# Patient Record
Sex: Male | Born: 1955 | Race: Black or African American | Hispanic: No | Marital: Single | Smoking: Current every day smoker
Health system: Southern US, Community
[De-identification: ages and names within clinical notes are randomized; demographics above are authoritative.]

## PROBLEM LIST (undated history)

## (undated) DIAGNOSIS — E785 Hyperlipidemia, unspecified: Secondary | ICD-10-CM

## (undated) DIAGNOSIS — M1990 Unspecified osteoarthritis, unspecified site: Secondary | ICD-10-CM

## (undated) HISTORY — DX: Hyperlipidemia, unspecified: E78.5

## (undated) HISTORY — DX: Unspecified osteoarthritis, unspecified site: M19.90

---

## 1999-02-28 ENCOUNTER — Encounter: Payer: Self-pay | Admitting: Emergency Medicine

## 1999-03-01 ENCOUNTER — Encounter: Payer: Self-pay | Admitting: Internal Medicine

## 1999-03-01 ENCOUNTER — Observation Stay (HOSPITAL_COMMUNITY): Admission: EM | Admit: 1999-03-01 | Discharge: 1999-03-02 | Payer: Self-pay | Admitting: Emergency Medicine

## 1999-03-02 ENCOUNTER — Encounter: Payer: Self-pay | Admitting: Internal Medicine

## 1999-03-05 ENCOUNTER — Encounter: Admission: RE | Admit: 1999-03-05 | Discharge: 1999-03-05 | Payer: Self-pay | Admitting: Internal Medicine

## 1999-06-26 ENCOUNTER — Emergency Department (HOSPITAL_COMMUNITY): Admission: EM | Admit: 1999-06-26 | Discharge: 1999-06-26 | Payer: Self-pay | Admitting: Emergency Medicine

## 1999-12-13 ENCOUNTER — Encounter: Payer: Self-pay | Admitting: Emergency Medicine

## 1999-12-13 ENCOUNTER — Inpatient Hospital Stay (HOSPITAL_COMMUNITY): Admission: EM | Admit: 1999-12-13 | Discharge: 1999-12-14 | Payer: Self-pay | Admitting: Emergency Medicine

## 1999-12-14 ENCOUNTER — Encounter: Payer: Self-pay | Admitting: Internal Medicine

## 2000-10-14 ENCOUNTER — Encounter: Payer: Self-pay | Admitting: Emergency Medicine

## 2000-10-14 ENCOUNTER — Emergency Department (HOSPITAL_COMMUNITY): Admission: EM | Admit: 2000-10-14 | Discharge: 2000-10-14 | Payer: Self-pay | Admitting: Emergency Medicine

## 2001-10-12 ENCOUNTER — Encounter: Payer: Self-pay | Admitting: Emergency Medicine

## 2001-10-12 ENCOUNTER — Inpatient Hospital Stay (HOSPITAL_COMMUNITY): Admission: EM | Admit: 2001-10-12 | Discharge: 2001-10-14 | Payer: Self-pay | Admitting: Emergency Medicine

## 2001-10-22 ENCOUNTER — Encounter: Admission: RE | Admit: 2001-10-22 | Discharge: 2001-10-22 | Payer: Self-pay | Admitting: Family Medicine

## 2001-11-03 ENCOUNTER — Encounter: Admission: RE | Admit: 2001-11-03 | Discharge: 2001-11-03 | Payer: Self-pay | Admitting: Family Medicine

## 2002-11-23 ENCOUNTER — Encounter: Admission: RE | Admit: 2002-11-23 | Discharge: 2002-11-23 | Payer: Self-pay | Admitting: Sports Medicine

## 2003-04-09 ENCOUNTER — Emergency Department (HOSPITAL_COMMUNITY): Admission: EM | Admit: 2003-04-09 | Discharge: 2003-04-09 | Payer: Self-pay | Admitting: Emergency Medicine

## 2004-12-31 ENCOUNTER — Emergency Department (HOSPITAL_COMMUNITY): Admission: EM | Admit: 2004-12-31 | Discharge: 2004-12-31 | Payer: Self-pay | Admitting: Emergency Medicine

## 2005-12-22 ENCOUNTER — Emergency Department (HOSPITAL_COMMUNITY): Admission: EM | Admit: 2005-12-22 | Discharge: 2005-12-22 | Payer: Self-pay | Admitting: Emergency Medicine

## 2005-12-25 ENCOUNTER — Emergency Department (HOSPITAL_COMMUNITY): Admission: EM | Admit: 2005-12-25 | Discharge: 2005-12-25 | Payer: Self-pay | Admitting: Emergency Medicine

## 2005-12-30 ENCOUNTER — Emergency Department (HOSPITAL_COMMUNITY): Admission: EM | Admit: 2005-12-30 | Discharge: 2005-12-30 | Payer: Self-pay | Admitting: Emergency Medicine

## 2006-04-17 DIAGNOSIS — J309 Allergic rhinitis, unspecified: Secondary | ICD-10-CM | POA: Insufficient documentation

## 2007-04-15 ENCOUNTER — Emergency Department (HOSPITAL_COMMUNITY): Admission: EM | Admit: 2007-04-15 | Discharge: 2007-04-15 | Payer: Self-pay | Admitting: Family Medicine

## 2007-07-11 ENCOUNTER — Emergency Department (HOSPITAL_COMMUNITY): Admission: EM | Admit: 2007-07-11 | Discharge: 2007-07-11 | Payer: Self-pay | Admitting: Emergency Medicine

## 2009-07-26 ENCOUNTER — Ambulatory Visit: Payer: Self-pay | Admitting: Family Medicine

## 2009-07-26 DIAGNOSIS — F17200 Nicotine dependence, unspecified, uncomplicated: Secondary | ICD-10-CM

## 2009-07-26 DIAGNOSIS — E119 Type 2 diabetes mellitus without complications: Secondary | ICD-10-CM

## 2010-03-20 NOTE — Assessment & Plan Note (Signed)
Summary: 55yo M new pt   Vital Signs:  Patient profile:   55 year old male Height:      73.75 inches Weight:      192.8 pounds BMI:     25.01 Temp:     98.7 degrees F oral Pulse rate:   75 / minute BP sitting:   111 / 76  (left arm) Cuff size:   regular  Vitals Entered By: Gladstone Pih (July 26, 2009 4:12 PM) CC: establish care Is Patient Diabetic? Yes Did you bring your meter with you today? No Pain Assessment Patient in pain? yes      Onset of pain  Chronic all over pain   Primary Care Provider:  Marisue Ivan  CC:  establish care.  History of Present Illness: 55yo M new pt  Health problems: 1. New dx of DM 2010- Currently Novolog 70/30- 30 units in AM, 20 units in PM; Not checking CBGs regularly.  But when he does, states the CBGs range 100s-220s.  No hypoglycemic events.  Due for diabetic exam.  2. HLD- dx in 2010.  States that he was prescribed medication for 6 months and instructed to stop afterwards.    3. Tobacco use- 4cig/day ([place] 100s) x 40 year hx  4. Chronic pain- "Hurts all over".  Not on any current medication at the time.  No hx of injury, MVA, or trauma.  Preventative: No prior hx of screenings.  No desire at this time b/c cannot financially afford it.  Social: Recently released from prison.  No insurance.  Plans to see Rudell Cobb for assistance.  Has no money to afford medications at this time but still has enough insulin.    Habits & Providers  Alcohol-Tobacco-Diet     Tobacco Status: current     Tobacco Counseling: to quit use of tobacco products     Cigarette Packs/Day: 0.25  Current Medications (verified): 1)  Novolog Mix 70/30 Flexpen 70-30 % Susp (Insulin Aspart Prot & Aspart) .... 30 Units in Am, 20 Units in The Evening 2)  Aspir-Low 81 Mg Tbec (Aspirin) .Marland Kitchen.. 1 Tab By Mouth Daily  Allergies (verified): No Known Drug Allergies  Past History:  Past Medical History: H.Pylori treated 11/2001- EGD (duodenal ulcer) -  11/03/2001 DM HLD Tobacco user  Past Surgical History: none  Family History: Father- Deceased, murdered Mother- Deceased, unk medical hx Siblings- healthy  Social History: Lives w/ Delphine Little in [place]. Unemployed Tobacco user- 4 cig/day x 40 year hx Occasionally EtOH use Endorses MJN use Recently incarceratedSmoking Status:  current Packs/Day:  0.25  Review of Systems       no CP w/ exertion, SOB, palpitations, or syncopal events  Physical Exam  General:  VS Reviewed. Well appearing, NAD.  Eyes:  EOMI.  PERRLA.  Red Reflex- present and symmetric intensity  symmetric light reflex  Mouth:  poor dentition Neck:  supple, full ROM, no goiter or mass  Lungs:  Normal respiratory effort, chest expands symmetrically. Lungs are clear to auscultation, no crackles or wheezes. Heart:  Normal rate and regular rhythm. S1 and S2 normal without gallop, murmur, click, rub or other extra sounds. Abdomen:  Soft, NT, ND, no HSM, active BS  Msk:  no joint effusion or erythema FROM in all joints Extremities:  no peripheral edema Neurologic:  no focal deficits Skin:  no acute lesions   Impression & Recommendations:  Problem # 1:  DIABETES MELLITUS, TYPE II (ICD-250.00) Assessment Unchanged Not at goal A1c 9%  He was dx with DM in prison.  I'm not sure why he was immediately started on insulin.  Plan to switch him over to Metformin and check a C-peptide when he gets established with Rudell Cobb.   No changes to the regimen at this time. He is schedule to see Rudell Cobb this week and will f/u after that.  His updated medication list for this problem includes:    Novolog Mix 70/30 Flexpen 70-30 % Susp (Insulin aspart prot & aspart) .Marland KitchenMarland KitchenMarland KitchenMarland Kitchen 30 units in am, 20 units in the evening    Aspir-low 81 Mg Tbec (Aspirin) .Marland Kitchen... 1 tab by mouth daily  Orders: A1C-[facility] (83036)Future Orders: Comp Met-[facility] ([id number]) ... 08/01/2010 CBC w/Diff-[facility] ([id number]) ... 08/01/2010 Lipid-[facility] ([id number])  ... 08/01/2010  Problem # 2:  TOBACCO USER (ICD-305.1) Assessment: Unchanged 1/4 ppd.  Smoking cessation counseling provided. He is in the contemplative stage of quitting.  Problem # 3:  Preventive Health Care (ICD-V70.0) Assessment: Comment Only Pt cannot afford any recommended screening at this time. Awaiting for him to establish assistance with [facility]. Pt is to start ASA 81mg  daily. BP under good control. Will check lipid panel later.  Complete Medication List: 1)  Novolog Mix 70/30 Flexpen 70-30 % Susp (Insulin aspart prot & aspart) .... 30 units in am, 20 units in the evening 2)  Aspir-low 81 Mg Tbec (Aspirin) .Marland Kitchen.. 1 tab by mouth daily  Patient Instructions: 1)  Follow up with Rudell Cobb to establish means to obtain medications and labs. 2)  Return to the clinic without eating breakfast to get bloodwork.   3)  Set up a follow up appt with [place] after you get your bloodwork to discuss your medical problems.   Laboratory Results   Blood Tests   Date/Time Received: July 26, 2009 4:25 PM  Date/Time Reported: July 26, 2009 4:57 PM   HGBA1C: 9.0%   (Normal Range: Non-Diabetic - 3-6%   Control Diabetic - 6-8%)  Comments: ...............test performed by......Marland KitchenBonnie A. [place], MLS (ASCP)cm       Prevention & Chronic Care Immunizations   Influenza vaccine: Not documented    Tetanus booster: 10/19/2001: Done.    Pneumococcal vaccine: Not documented  Colorectal Screening   Hemoccult: Not documented   Hemoccult action/deferral: Deferred  (07/26/2009)    Colonoscopy: Not documented   Colonoscopy action/deferral: Deferred  (07/26/2009)  Other Screening   PSA: Not documented   PSA action/deferral: Discussed-decision deferred  (07/26/2009)   Smoking status: current  (07/26/2009)  Diabetes Mellitus   HgbA1C: 9.0  (07/26/2009)    Eye exam: Not documented    Foot exam: Not documented   High risk foot: Not documented   Foot care education: Not  documented    Urine microalbumin/creatinine ratio: Not documented    Diabetes flowsheet reviewed?: Yes   Progress toward A1C goal: Unchanged  Lipids   Total Cholesterol: Not documented   LDL: Not documented   LDL Direct: Not documented   HDL: Not documented   Triglycerides: Not documented  Self-Management Support :   Personal Goals (by the next clinic visit) :     Personal A1C goal: 8  (07/26/2009)     Personal blood pressure goal: 140/90  (07/26/2009)     Personal LDL goal: 100  (07/26/2009)    Patient will work on the following items until the next clinic visit to reach self-care goals:     Medications and monitoring: take my medicines every day, check my blood sugar, bring all of  my medications to every visit  (07/26/2009)     Eating: drink diet soda or water instead of juice or soda, eat more vegetables, use fresh or frozen vegetables, eat foods that are low in salt, eat baked foods instead of fried foods, eat fruit for snacks and desserts, limit or avoid alcohol  (07/26/2009)    Diabetes self-management support: CBG self-monitoring log, Written self-care plan, Education handout  (07/26/2009)   Diabetes care plan printed   Diabetes education handout printed

## 2010-07-06 NOTE — Consult Note (Signed)
NAME:  [name] [name], [name] [name]                           ACCOUNT NO.:  [id number]   MEDICAL RECORD NO.:  [id number]                   PATIENT TYPE:  INP   LOCATION:  3316                                 FACILITY:  [facility]   PHYSICIAN:  James L. Malon Kindle., M.D.          DATE OF BIRTH:  Dec 31, 1955   DATE OF CONSULTATION:  10/13/2001  DATE OF DISCHARGE:                                   CONSULTATION   REASON FOR CONSULTATION:  Hematemesis.   HISTORY:  This 55 year old patient who comes in unassigned through the  emergency room with hematemesis.  He had been working over the weekend and  felt sick without real specific symptoms but took some Advil.  York Spaniel he took  a total of 4 Advil.  This was the first time he ever had Advil in his life.  He subsequently vomited up dark material, had some fever and chills.  He had  a similar spell about 2 years ago.  He came in with chest pain, was ruled  out for myocardial infarction, had an exercise Cardiolite, was given  Protonix.  He left AMA at that time.  He has some history of ulcers in the  past but is not really sure how that was diagnosed but apparently during  that admission someone told him he might have ulcers.  He currently denies  any epigastric pain, dyspepsia, dysphasia.   MEDICATIONS ON ADMISSION:  Tylenol.  Takes no medicine chronically.   PAST MEDICAL HISTORY:  History of polysubstance abuse.  No previous  surgeries.  He does have a history of hypertension and high cholesterol.   FAMILY HISTORY:  Parents died of unknown causes.   SOCIAL HISTORY:  Lives in [place], works for a Geologist, engineering interstate bridges.  Has children who are not in the area.  He  lives alone and drinks 2 liters a day.  Does smoke marijuana.  On weekends  drinks more.   PHYSICAL EXAM:  Patient is afebrile.  Blood pressure 123/88.  GENERAL: Pleasant enough black male in no acute distress.  EYES: Clear, nonicteric.  NECK: Supple.  No  lymphadenopathy.  LUNGS: Clear.  HEART: Regular rate and rhythm.  No murmurs or gallops.  ABDOMEN: Soft and nontender.   ASSESSMENT:  Hematemesis probably due to either Mallory-Weiss tear,  gastritis or possibly an ulcer.  With his ethanol and cigarette use, I think  an endoscopy would be appropriate to rule out cancer.    PLAN:  We will go ahead and give him clear liquids now.  Continue the  Protonix and plan an endoscopy tomorrow.  I have discussed this with the  patient.  He is agreeable.  James L. Malon Kindle., M.D.    Waldron Session  D:  10/13/2001  T:  10/15/2001  Job:  [id number]   cc:   William A. Hensel, M.D.  [address] [place]  [place] [address]  Fax: [phone]

## 2010-07-06 NOTE — Discharge Summary (Signed)
NAME:  [name], [name] NO.:  [id number]   MEDICAL RECORD NO.:  [id number]                   PATIENT TYPE:  INP   LOCATION:  4733                                 FACILITY:  [facility]   PHYSICIAN:  Candance Bohlman DICTATOR                    DATE OF BIRTH:  23-Jan-1956   DATE OF ADMISSION:  10/12/2001  DATE OF DISCHARGE:  10/14/2001                                 DISCHARGE SUMMARY   DISCHARGE DIAGNOSES:  1. Duodenal ulcer.  2. GI bleed.  3. Tobacco abuse.  4. Alcohol abuse.  5. Polysubstance abuse with a positive history of cocaine and marijuana.   DISCHARGE MEDICATIONS:  Protonix 40 mg bid for ulcer.   HISTORY OF PRESENT ILLNESS:  The patient is a 55 year old African-American  male presenting secondary to a one day history of bloody emesis. The patient  reported three day history of vomiting with a total of seven to eight  episodes of vomiting per day. On the day of presentation, the patient  developed bloody emesis. He denied diarrhea, melena, or bloody stools.  Positive for chills and sweats. He has a tobacco history of one pack per day  for thirty years and Ethanol history of approximately (per patient), two  beers per day with occasional cocaine and marijuana use. The patient was  ad[facility] to [facility] for the following problems.   PROBLEM:  1. Upper GI bleed. Upon admission, the patient was hemodynamically stable     with hemoglobin of 17.3 and hematocrit of 52.8. He was typed and screened     and CBC's were drawn every four hours, to make sure that he did not     decompensate. Aggressive IV hydration was begun. His hemoglobin over the     next couple of days decreased but stabilized at 15.1 and 15.3 on October 03, 2001 and 16.4 on October 14, 2001. He was followed by Dr. Randa Evens of     Gastroenterology and upper endoscopy was done on October 14, 2001 that     showed a small duodenal ulcer. At the time of discharge a CLO  H-pylori     test was pending. He was encouraged to continue his Protonix bid for     which he was given a prescription and to avoid alcohol, aspirin, and     NSAIDS. His pain had resolved by the time of discharge and he was     tolerating a regular diet.  2. Dehydration, which was mild. He was aggressively hydrated overnight and     his electrolytes were stable. For the first night, he was NPO but     thereafter, tolerated a regular diet.  3. Leukocytosis. The patient had a CBC on admission that showed a WBC count     of 16.0. This was potentially due to acute phase reactant versus  gastritis. The patient was afebrile throughout this hospital stay. His     WBC count continued to decline to 14.7 on October 13, 2001 and 12.9 on     October 14, 2001, the day of his discharge.  4. Tobacco abuse. The patient was encouraged to quit smoking.  5. Alcohol abuse. He has no history of withdrawal. He did not experience any     DT's during admission and toxicology screen was negative for alcohol.  6. Polysubstance abuse. A toxicology screen was negative for all substances     including cocaine, heroin, other opiates, and benzodiazepines. He was     however, positive for marijuana.    FOLLOW UP:  1. With [facility] with Dr. Nino Parsley on Tuesday,     November 03, 2001 at 11:00 a.m.  2. With [facility] on [address]. The patient was     told to call for an appointment with Dr. Randa Evens in six weeks.                                               Jullianna Gabor DICTATOR    DD/MEDQ  D:  10/14/2001  T:  10/17/2001  Job:  [id number]   cc:   William A. Hensel, M.D.  [address] [place]  [place] [address]  Fax: [phone]   Nino Parsley, M.D.

## 2010-07-06 NOTE — Op Note (Signed)
   NAME:  [name] [name], [name] [name]                           ACCOUNT NO.:  [id number]   MEDICAL RECORD NO.:  [id number]                   PATIENT TYPE:  INP   LOCATION:  4733                                 FACILITY:  [facility]   PHYSICIAN:  James L. Malon Kindle., M.D.          DATE OF BIRTH:  01/23/1956   DATE OF PROCEDURE:  10/14/2001  DATE OF DISCHARGE:                                 OPERATIVE REPORT   PROCEDURE:  Esophagogastroduodenoscopy with biopsy.   MEDICATIONS:  Cetacaine spray, fentanyl 75 mcg, Versed 6 mg IV.   INDICATIONS:  Hematemesis.   DESCRIPTION OF PROCEDURE:  The procedure had been explained to the patient  and consent obtained.  With the patient in the left lateral decubitus  position, the Olympus upper endoscope was inserted and advanced under direct  visualization.  The stomach was entered, the pylorus identified and passed.  The duodenum including the bulb and the second portion was seen well.  There  was a small duodenal ulcer on the anterior wall with marked duodenitis.  The  scope was withdrawn back into the stomach.  The pyloric channel was normal.  The antrum was also normal.  A biopsy was taken for rapid urease test for  Helicobacter.  Fundus and cardia seen well on the retroflex view and were  normal.  The scope was withdrawn.  The distal and proximal esophagus was  seen well and was normal.  There were no varices and a minimal hiatal  hernia.  The proximal esophagus was normal.  The patient tolerated the  procedure well.  He was somewhat agitated.  There were no immediate  complications.   IMPRESSION:  Small duodenal ulcer and duodenitis, almost certainly the  source of his hematemesis.   PLAN:  Will check a CLOtest but discharge on Protonix.  Will see back in the  office in six weeks.                                               James L. Malon Kindle., M.D.    Waldron Session  D:  10/14/2001  T:  10/18/2001  Job:  [id number]   cc:   William A. Hensel, M.D.  [address] [place]  [place] [address]  Fax: [phone]

## 2010-07-06 NOTE — Discharge Summary (Signed)
[place]  Patient:    [name] [name], ATTIA                        MRN: [id number] Adm. Date:  [id number] Disc. Date: [id number] Attending:  Lewayne Bunting Dictator:   Darrol Jump CC:         [facility]   Discharge Summary  DATE OF BIRTH: Jan 09, 1956  DISCHARGE DIAGNOSES:  1. Chest pain.  2. Elevated blood pressure.  3. Tobacco use.  4. History of cocaine use.  5. Occasional alcohol use.  6. Peptic ulcer disease.  PROCEDURES: Stress Cardiolite study done on December 14, 1999, with the following results: Nondiagnostic because the patient was unable to achieve 85% of maximum predicted heart rate.  Ejection fraction was noted to be 46% by scan.  Scan also revealed small fixed defect but no apparent ischemia.  SPECIAL NOTE: The patient left against medical advice before being given discharge papers or prescriptions.  HISTORY OF PRESENT ILLNESS: This 55 year old African-American male, with no known cardiac history and an insignificant past medical history, was seen in the [facility] Room for complaint of chest pain on the evening of December 13, 1999.  The patient is a Corporate investment banker from [place] and he started having chest pain that was located on the left side the date prior to admission at work.  The pain was a fleeting sharp pain.  He denied any associated shortness of breath, radiation, nausea, diaphoresis, palpitations, or presyncope.  Again the day of admission at work he had chest pain that was of the same quality and had the same associated symptoms.  He was worried and decided to come to the emergency room.  His pain got better with rest.  He was pain-free on initial evaluation.  He did not receive any nitroglycerin.  ALLERGIES: No known drug allergies.  SOCIAL HISTORY: The patient has smoked one packs of cigarettes per day for 30 years.  He has positive alcohol use on the weekends.  He also  admits to occasional cocaine use.  PHYSICAL EXAMINATION:  GENERAL: Well-developed, well-nourished male in no acute distress.  VITAL SIGNS: Blood pressure 130/90, pulse 75, respirations 20.  NECK: Without JVD or bruits.  CARDIAC: Regular rate and rhythm, S1 and S2, positive S4; no murmurs, clicks, rubs, or gallops.  LUNGS: Clear to auscultation bilaterally.  ABDOMEN: Soft, nontender.  EXTREMITIES: Without edema.  LABORATORY DATA: EKG showed heart rate of 98, normal sinus rhythm; poor R wave progression.  Chest x-ray showed borderline heart size, indistinct vascular congestion suggesting mild CHF.  Hemoglobin 17, hematocrit 49.  Sodium 143, potassium 3.7, chloride 107, CO2 23, BUN 17, creatinine 1.3.  Glucose 110.  HOSPITAL COURSE: The patient was admitted for atypical chest pain.  He was placed on Lopressor 25 mg b.i.d. and aspirin.  His lipid profile came back elevated, with a total cholesterol of 248, triglyceride 155, HDL 39, and LDL 178.  He ruled out for myocardial infarction by enzymes.  Total CK #1 was 207, #2 was 179, #3 was 165, and #4 was 154.  CK-MB #1 was 2.3, #2 was 1.9,, #3 was 2.0, and #4 was 2.0.  Troponin I was 0.01 x 4.  Urine drug screen was positive for cocaine and cannabinoid, all other tests negative.  The patient went for an exercise Cardiolite study on December 14, 1999 and as noted above this was nondiagnostic because the patient was unable  to achieve 85% of predicted maximum heart rate.  The patient was somewhat concerned about his chest pain and he was advised that if he had recurrent chest pain he was to return to the ER.  It was decided at that time he would follow up in the office with the physician assistant and if he had recurrent pain he would be considered for possible catheterization.  Plans were for him to be discharged on aspirin, beta-blocker, and p.r.n. nitroglycerin.  The patient was awaiting discharge and unfortunately he was unable to  wait for proper discharge instructions and prescriptions.  He left AMA before being seen by the physician assistant.  DISCHARGE MEDICATIONS: Unknown.  FOLLOW-UP: A message has been left with the office to call the patient regarding a follow-up appointment with the physician assistant, either this week or next. DD:  12/25/99 TD:  12/25/99 Job: [id number]

## 2010-08-01 ENCOUNTER — Ambulatory Visit (INDEPENDENT_AMBULATORY_CARE_PROVIDER_SITE_OTHER): Payer: Medicaid Other | Admitting: Sports Medicine

## 2010-08-01 ENCOUNTER — Encounter: Payer: Self-pay | Admitting: Family Medicine

## 2010-08-01 VITALS — BP 127/88 | HR 71 | Temp 97.5°F | Ht 76.0 in | Wt 181.0 lb

## 2010-08-01 DIAGNOSIS — E119 Type 2 diabetes mellitus without complications: Secondary | ICD-10-CM

## 2010-08-01 DIAGNOSIS — E785 Hyperlipidemia, unspecified: Secondary | ICD-10-CM

## 2010-08-01 DIAGNOSIS — A6000 Herpesviral infection of urogenital system, unspecified: Secondary | ICD-10-CM

## 2010-08-01 LAB — COMPREHENSIVE METABOLIC PANEL
ALT: <8 U/L (ref 0–53)
AST: 9 U/L (ref 0–37)
Albumin: 4.6 g/dL (ref 3.5–5.2)
BUN: 15 mg/dL (ref 6–23)
Calcium: 10.3 mg/dL (ref 8.4–10.5)
Chloride: 101 mEq/L (ref 96–112)
Potassium: 4 mEq/L (ref 3.5–5.3)
Sodium: 136 mEq/L (ref 135–145)
Total Protein: 7.5 g/dL (ref 6.0–8.3)

## 2010-08-01 LAB — POCT GLYCOSYLATED HEMOGLOBIN (HGB A1C): Hemoglobin A1C: 13.5

## 2010-08-01 LAB — LIPID PANEL
Cholesterol: 279 mg/dL — ABNORMAL HIGH (ref 0–200)
LDL Cholesterol: 168 mg/dL — ABNORMAL HIGH (ref 0–99)
VLDL: 68 mg/dL — ABNORMAL HIGH (ref 0–40)

## 2010-08-01 LAB — POCT UA - MICROALBUMIN: Microalbumin Ur, POC: 80 mg/dL

## 2010-08-01 MED ORDER — LISINOPRIL 2.5 MG PO TABS: 2.5000 mg | ORAL_TABLET | Freq: Every day | ORAL | Status: DC

## 2010-08-01 MED ORDER — METFORMIN HCL 1000 MG PO TABS: 1000.0000 mg | ORAL_TABLET | Freq: Every day | ORAL | Status: DC

## 2010-08-01 MED ORDER — VALACYCLOVIR HCL 1 G PO TABS: 1000.0000 mg | ORAL_TABLET | Freq: Every day | ORAL | Status: AC

## 2010-08-01 MED ORDER — ASPIRIN EC 81 MG PO TBEC: 81.0000 mg | DELAYED_RELEASE_TABLET | Freq: Every day | ORAL | Status: AC

## 2010-08-01 NOTE — Patient Instructions (Signed)
Great to see you, Checking some bloodwork. Start Valtrex for Herpes. Metformin for diabetes. Baby aspirin for heart protection. Come back to see me before the end of June to go over all the bloodwork.  Ihor Austin. Benjamin Stain, M.D. [facility] [address] [place], [place] [address] [phone]  Diabetes, Type 2 Diabetes is a lasting (chronic) disease. In type 2 diabetes, the pancreas does not make enough insulin (a hormone), and the body does not respond normally to the insulin that is made. This type of diabetes was also previously called adult onset diabetes. About 90% of all those who have diabetes have type 2. It usually occurs after the age of 57 but can occur at any age. CAUSES Unlike type 1 diabetes, which happens because insulin is no longer being made, type 2 diabetes happens because the body is making less insulin and has trouble using the insulin properly. SYMPTOMS  Drinking more than usual.   Urinating more than usual.   Blurred vision.   Dry, itchy skin.   Frequent infection like yeast infections in women.   More tired than usual (fatigue).  TREATMENT  Healthy eating.   Exercise.   Medication, if needed.   Monitoring blood glucose (sugar).   Seeing your caregiver regularly.  HOME CARE INSTRUCTIONS  Check your blood glucose (sugar) at least once daily. More frequent monitoring may be necessary, depending on your medications and on how well your diabetes is controlled. Your caregiver will advise you.   Take your medicine as directed by your caregiver.   Do not smoke.   Make wise food choices. Ask your caregiver for information. Weight loss can improve your diabetes.   Learn about low blood glucose (hypoglycemia) and how to treat it.   Get your eyes checked regularly.   Have a yearly physical exam. Have your blood pressure checked. Get your blood and urine tested.   Wear a pendant or bracelet saying that you have diabetes.    Check your feet every night for sores. Let your caregiver know if you have sores that are not healing.  SEEK MEDICAL CARE IF:  You are having problems keeping your blood glucose at target range.   You feel you might be having problems with your medicines.   You have symptoms of an illness that is not improving after 24 hours.   You have a sore or wound that is not healing.   You notice a change in vision or a new problem with your vision.   You develop a fever of more than 100.4.  Document Released: 02/04/2005 Document Re-Released: 02/26/2009 [facility] Patient Information 2011 [place], [place].  Genital Herpes Genital herpes is a sexually transmitted disease. This means that it is a disease passed by having sex with an infected person. There is no cure for genital herpes. The time between attacks can be months to years. The virus may live in a person but produce no problems (symptoms). This infection can be passed to a baby as it travels down the birth canal (vagina). In a newborn, this can cause central nervous system damage, eye damage or even death. The virus that causes genital herpes is usually HSV-2 virus. The virus that causes oral herpes is usually HSV-1. The diagnosis (learning what is wrong) is made through culture results. SYMPTOMS Usually symptoms of pain and itching begin a couple days to a week after contact. It first appears as small blisters that progress to small painful ulcers which then scab over  and heal after several days. It affects the outer genitalia, birth canal, cervix, penis, anal area, buttocks and thighs. HOME CARE INSTRUCTIONS  Keep ulcerated areas dry and clean.   Take medications as directed. Antiviral medications can speed up healing. They will not prevent recurrences or cure this infection. These medications can also be taken for suppression if there are frequent recurrences.   WARNING: While the infection is active, it is contagious. Avoid all sexual  contact during active infections.   Condoms may help prevent spread of the herpes virus.   Practice safe sex.   Wash your hands thoroughly after touching the genital area.   Avoid touching your eyes after touching your genital area.   Inform your caregiver if you have had genital herpes and become pregnant. It is your responsibility to insure a safe outcome for your baby in this pregnancy.   Only take over-the-counter or prescription medicines for pain, discomfort, or fever as directed by your caregiver.  SEEK MEDICAL CARE IF:  You have a recurrence of this infection.   You do not respond to medications and are not improving.   You have new sources of pain or discharge which have changed from the original infection.   You have an oral temperature above 100.4.   You develop abdominal pain.   You develop eye pain or signs of eye infection.  Document Released: 02/02/2000 Document Re-Released: 05/01/2009 [facility] Patient Information 2011 [place], [place].

## 2010-08-01 NOTE — Progress Notes (Signed)
Addended by: Monica Becton on: 08/01/2010 05:05 PM   Modules accepted: Orders

## 2010-08-01 NOTE — Assessment & Plan Note (Addendum)
Checking HSV serologies to confirm. Valtrex daily. Counseled on safe sex practices. Checking HIV, RPR as well. I would recommend checking for Gc/Chlam at the next visit before he has voided.

## 2010-08-01 NOTE — Assessment & Plan Note (Addendum)
Starting metformin 1000. RTC to f/u other labs. Then RTC 3 months to recheck and augment meds. Lipid panel. Starting ASA 81mg  qd. CMET. Elevated microalbumin, will add low dose lisinopril.

## 2010-08-01 NOTE — Progress Notes (Signed)
  Subjective:    Patient ID: [name] [name], male    DOB: Jul 11, 1955, 55 y.o.   MRN: [id number]  HPI Here for new Pt eval.  Recently released from prison.  DM2:  Uncontrolled, was tx with insulin 70/30 in prison.  Genital lesion:  Bumps on penis, come and go, gets a burning/itching before they come up.  Sexually active with male partner and wears condom at all times.  Review of Systems    See HPI Objective:   Physical Exam  Constitutional: He appears well-developed and well-nourished. No distress.  HENT:  Head: Normocephalic and atraumatic.  Nose: Nose normal.  Mouth/Throat: Oropharynx is clear and moist.  Eyes: Conjunctivae and EOM are normal. Pupils are equal, round, and reactive to light.  Neck: Neck supple. No JVD present. No tracheal deviation present. No thyromegaly present.  Cardiovascular: Normal rate, regular rhythm and normal heart sounds.  Exam reveals no gallop and no friction rub.   No murmur heard. Pulmonary/Chest: Effort normal and breath sounds normal. No respiratory distress. He has no wheezes. He has no rales.  Abdominal: Soft. He exhibits no distension and no mass. There is no tenderness. There is no rebound and no guarding.  Genitourinary:          Shallow ulcers present over glans and foreskin. Some palpable LAD in bilateral groin.  Musculoskeletal: He exhibits no edema.  Lymphadenopathy:    He has no cervical adenopathy.  Neurological: He is alert.  Skin: Skin is warm and dry. He is not diaphoretic.          Assessment & Plan:

## 2010-08-02 ENCOUNTER — Telehealth: Payer: Self-pay | Admitting: *Deleted

## 2010-08-02 ENCOUNTER — Encounter: Payer: Self-pay | Admitting: Sports Medicine

## 2010-08-02 DIAGNOSIS — E785 Hyperlipidemia, unspecified: Secondary | ICD-10-CM | POA: Insufficient documentation

## 2010-08-02 MED ORDER — SIMVASTATIN 40 MG PO TABS: 40.0000 mg | ORAL_TABLET | Freq: Every day | ORAL | Status: DC

## 2010-08-02 NOTE — Progress Notes (Signed)
Addended by: Monica Becton on: 08/02/2010 08:51 AM   Modules accepted: Orders

## 2010-08-02 NOTE — Telephone Encounter (Signed)
Spoke with patient and informed him that lisinopril was called in and to add that to his medications due to the protein in his urine. He will discuss this at his next visit on 6/26

## 2010-08-02 NOTE — Assessment & Plan Note (Signed)
Starting simvastatin 40. Recheck in 3 months.

## 2010-08-02 NOTE — Telephone Encounter (Signed)
Message copied by Farrell Ours on Thu Aug 02, 2010  9:12 AM ------      Message from: Monica Becton      Created: Wed Aug 01, 2010  5:05 PM       Pt has some protein in urine, Adding lisinopril, pls call to let him know to add that to his medications.      Ihor Austin. Benjamin Stain, M.D.

## 2010-08-14 ENCOUNTER — Ambulatory Visit: Payer: Medicaid Other | Admitting: Sports Medicine

## 2010-09-12 ENCOUNTER — Ambulatory Visit (INDEPENDENT_AMBULATORY_CARE_PROVIDER_SITE_OTHER): Payer: Medicaid Other | Admitting: Family Medicine

## 2010-09-12 ENCOUNTER — Encounter: Payer: Self-pay | Admitting: Family Medicine

## 2010-09-12 VITALS — BP 134/92 | HR 75 | Ht 76.0 in | Wt 180.3 lb

## 2010-09-12 DIAGNOSIS — M549 Dorsalgia, unspecified: Secondary | ICD-10-CM

## 2010-09-12 MED ORDER — MELOXICAM 15 MG PO TABS: 15.0000 mg | ORAL_TABLET | Freq: Every day | ORAL | Status: AC

## 2010-09-12 MED ORDER — CYCLOBENZAPRINE HCL 10 MG PO TABS: 10.0000 mg | ORAL_TABLET | Freq: Three times a day (TID) | ORAL | Status: AC | PRN

## 2010-09-12 NOTE — Patient Instructions (Signed)
Try taking 500 or 650mg  of acetaminophen every six hours for a couple of days and see how your pain is. You can also try taking mobic 15mg  daily to help. Finally, I am giving you a prescription for flexeril to try, mainly at night, for pain.  It can make you sleepy, so don't take it before driving at first.

## 2010-09-20 NOTE — Progress Notes (Signed)
Subjective:   BACK PAIN  Location: Low back Quality: achy Onset: constant Worse with: activity, being on the floor Better with: rest Radiation: none Trauma: None Best sitting/standing/leaning forward: None  Red Flags Fecal/urinary incontinence: no  Numbness/Weakness: no  Fever/chills/sweats: no  Night pain: no  Unexplained weight loss: no  No relief with bedrest: no  h/o cancer/immunosuppression: no  IV drug use: no  PMH of osteoporosis or chronic steroid use: no   Objective:  Filed Vitals:   09/12/10 1715  BP: 134/92  Pulse: 75   GEN: NAD CV: RRR RESP: CTABL BACK: Pt has tight paraspinous muscles with pain on palpation.  No bony abnormalities. No point tenderness. EXT: No edema, 2+ pulses,

## 2010-09-20 NOTE — Assessment & Plan Note (Signed)
Pain appears to be either MSK or related to degenerative changes.  Will give prescription for Mobic and Flexeral and rec scheduled tylenol.  Imaging from several years ago is not worrisome and pt history has no red-flags.  Will follow up at a future date and possibly consider PT/ROM exercises.  Pt is somewhat resistant to taking medicine more than one time per day, hence Mobic.

## 2010-09-23 ENCOUNTER — Emergency Department (HOSPITAL_COMMUNITY)
Admission: EM | Admit: 2010-09-23 | Discharge: 2010-09-23 | Disposition: A | Discharge status: Home / Self Care | Payer: Medicaid Other | Attending: Emergency Medicine | Admitting: Emergency Medicine

## 2010-09-23 DIAGNOSIS — R112 Nausea with vomiting, unspecified: Secondary | ICD-10-CM | POA: Insufficient documentation

## 2010-09-23 DIAGNOSIS — R109 Unspecified abdominal pain: Secondary | ICD-10-CM | POA: Insufficient documentation

## 2010-09-23 DIAGNOSIS — E119 Type 2 diabetes mellitus without complications: Secondary | ICD-10-CM | POA: Insufficient documentation

## 2010-09-23 DIAGNOSIS — R10816 Epigastric abdominal tenderness: Secondary | ICD-10-CM | POA: Insufficient documentation

## 2010-09-23 DIAGNOSIS — E860 Dehydration: Secondary | ICD-10-CM | POA: Insufficient documentation

## 2010-09-23 LAB — DIFFERENTIAL
Basophils Absolute: 0 10*3/uL (ref 0.0–0.1)
Lymphocytes Relative: 22 % (ref 12–46)
Lymphs Abs: 3.3 10*3/uL (ref 0.7–4.0)
Neutro Abs: 10.2 10*3/uL — ABNORMAL HIGH (ref 1.7–7.7)
Neutrophils Relative %: 69 % (ref 43–77)

## 2010-09-23 LAB — COMPREHENSIVE METABOLIC PANEL
AST: 11 U/L (ref 0–37)
Alkaline Phosphatase: 86 U/L (ref 39–117)
BUN: 16 mg/dL (ref 6–23)
CO2: 27 mEq/L (ref 19–32)
Chloride: 98 mEq/L (ref 96–112)
Creatinine, Ser: 0.87 mg/dL (ref 0.50–1.35)
GFR calc non Af Amer: >60 mL/min (ref >60)
Potassium: 4.3 mEq/L (ref 3.5–5.1)
Total Bilirubin: 0.4 mg/dL (ref 0.3–1.2)

## 2010-09-23 LAB — GLUCOSE, CAPILLARY

## 2010-09-23 LAB — CBC
HCT: 49.1 % (ref 39.0–52.0)
MCV: 86.9 fL (ref 78.0–100.0)
RBC: 5.65 MIL/uL (ref 4.22–5.81)
WBC: 14.8 10*3/uL — ABNORMAL HIGH (ref 4.0–10.5)

## 2010-09-23 LAB — LIPASE, BLOOD: Lipase: 18 U/L (ref 11–59)

## 2010-11-14 LAB — COMPREHENSIVE METABOLIC PANEL
ALT: 20
AST: 23
Alkaline Phosphatase: 83
CO2: 21
Calcium: 9.5
Chloride: 102
GFR calc Af Amer: >60
GFR calc non Af Amer: >60
Glucose, Bld: 242 — ABNORMAL HIGH
Potassium: 4.5
Sodium: 134 — ABNORMAL LOW
Total Bilirubin: 1.3 — ABNORMAL HIGH

## 2010-11-14 LAB — DIFFERENTIAL
Basophils Relative: 0
Eosinophils Absolute: 0
Eosinophils Relative: 0
Lymphs Abs: 2.1
Neutrophils Relative %: 73

## 2010-11-14 LAB — LIPASE, BLOOD: Lipase: 52

## 2010-11-14 LAB — URINALYSIS, ROUTINE W REFLEX MICROSCOPIC
Bilirubin Urine: NEGATIVE
Glucose, UA: 100 — AB
Ketones, ur: NEGATIVE
Leukocytes, UA: NEGATIVE
Protein, ur: 30 — AB
pH: 6.5

## 2010-11-14 LAB — URINE MICROSCOPIC-ADD ON

## 2010-11-14 LAB — CBC
Hemoglobin: 16.2
MCHC: 34.7
RBC: 5.26
WBC: 12.2 — ABNORMAL HIGH

## 2011-08-20 ENCOUNTER — Other Ambulatory Visit: Payer: Self-pay | Admitting: *Deleted

## 2011-08-20 DIAGNOSIS — E119 Type 2 diabetes mellitus without complications: Secondary | ICD-10-CM

## 2011-08-21 ENCOUNTER — Telehealth: Payer: Self-pay | Admitting: Family Medicine

## 2011-08-21 MED ORDER — METFORMIN HCL 1000 MG PO TABS: 1000.0000 mg | ORAL_TABLET | Freq: Every day | ORAL | Status: DC

## 2011-08-21 NOTE — Telephone Encounter (Signed)
Received refill request for valtrex.  Will need appointment for this to be filled.

## 2011-08-21 NOTE — Telephone Encounter (Signed)
LVM for patient to call back to inform of below 

## 2011-12-09 ENCOUNTER — Other Ambulatory Visit: Payer: Self-pay | Admitting: Sports Medicine

## 2012-02-05 ENCOUNTER — Ambulatory Visit (INDEPENDENT_AMBULATORY_CARE_PROVIDER_SITE_OTHER): Payer: Medicaid Other | Admitting: Family Medicine

## 2012-02-05 ENCOUNTER — Encounter: Payer: Self-pay | Admitting: Family Medicine

## 2012-02-05 VITALS — BP 124/88 | HR 93 | Temp 98.2°F | Ht 76.0 in | Wt 169.9 lb

## 2012-02-05 DIAGNOSIS — E119 Type 2 diabetes mellitus without complications: Secondary | ICD-10-CM

## 2012-02-05 DIAGNOSIS — E1149 Type 2 diabetes mellitus with other diabetic neurological complication: Secondary | ICD-10-CM

## 2012-02-05 DIAGNOSIS — E785 Hyperlipidemia, unspecified: Secondary | ICD-10-CM

## 2012-02-05 DIAGNOSIS — E1140 Type 2 diabetes mellitus with diabetic neuropathy, unspecified: Secondary | ICD-10-CM

## 2012-02-05 DIAGNOSIS — E1142 Type 2 diabetes mellitus with diabetic polyneuropathy: Secondary | ICD-10-CM

## 2012-02-05 DIAGNOSIS — N400 Enlarged prostate without lower urinary tract symptoms: Secondary | ICD-10-CM

## 2012-02-05 LAB — POCT GLYCOSYLATED HEMOGLOBIN (HGB A1C): Hemoglobin A1C: 12

## 2012-02-05 MED ORDER — METFORMIN HCL 1000 MG PO TABS: 1000.0000 mg | ORAL_TABLET | Freq: Two times a day (BID) | ORAL | Status: DC

## 2012-02-05 MED ORDER — GLIPIZIDE 5 MG PO TABS: 5.0000 mg | ORAL_TABLET | Freq: Every day | ORAL | Status: DC

## 2012-02-05 MED ORDER — TAMSULOSIN HCL 0.4 MG PO CAPS: 0.4000 mg | ORAL_CAPSULE | Freq: Every day | ORAL | Status: DC

## 2012-02-05 NOTE — Progress Notes (Signed)
Patient ID: SIRRON FRANCESCONI, male   DOB: [birth year], 56 y.o.   MRN: [id number] Subjective: The patient is a 56 y.o. year old male who presents today for physical exam.  1. Diabetes: Patient continues to take metformin once a day on most days. He never checks his blood sugar. He has been on insulin in the past. He admits to poor diet and not very good exercise habits.  2. Hyperlipidemia: Patient stopped taking his Zocor. He just got tired of taking it.  3. Leg pain: Patient reports sensation of numbness, tingling, and shooting pains in his legs. an intermittent problem. It is not associated with any weakness. It is not associated with any bowel symptoms.  4. Urinary problems: Patient reports that for some time now he's been having problems with slow initiation of urination and decreased flow. He has been having some polyuria but no dysuria. He has never before been diagnosed with BPH has never been on any medications.  Patient's past medical, social, and family history were reviewed and updated as appropriate. History  Substance Use Topics  . Smoking status: Current Some Day Smoker -- 0.5 packs/day for 30 years    Types: Cigarettes  . Smokeless tobacco: Not on file  . Alcohol Use: 8.4 oz/week    14 Glasses of wine per week   Objective:  Filed Vitals:   02/05/12 1618  BP: 124/88  Pulse: 93  Temp: 98.2 F (36.8 C)   Gen: No acute distress, thin HEENT: Mucous members was, extraocular movements intact, poor dentition, multiple teeth are missing. Throat is nonerythematous. CV: Regular rate and rhythm, no murmurs appreciated Resp: Clear to auscultation bilaterally Abdomen: Soft, nontender, nondistended Ext: 2+ pulses, no edema  Assessment/Plan:  Please also see individual problems in problem list for problem-specific plans.

## 2012-02-05 NOTE — Assessment & Plan Note (Signed)
Signs and symptoms consistent with BPH. I will start an imperic Trial of Flomax. Followup in 2-3 weeks.

## 2012-02-05 NOTE — Assessment & Plan Note (Signed)
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 I am not starting any medications for now but will monitor this. He is not currently bothered enough with a 2 consider starting something like gabapentin.

## 2012-02-05 NOTE — Assessment & Plan Note (Signed)
The patient's diabetes is poorly controlled at this time. I had a long discussion with him about options and the fact that we would likely end up having to go down the road of insulin again if he does not make significant lifestyle changes. He seems open to this at this time. I will refer him for diabetic education and provide him with handouts on changes to his diet. I am recommending that he have a discussion with his wife about the number of sweets that are kept at home. I also increasing his dose of metformin and started him on glipizide. We'll follow him up closely.

## 2012-02-05 NOTE — Patient Instructions (Signed)
Diet Recommendations for Diabetes   Starchy (carb) foods include: Bread, rice, pasta, potatoes, corn, crackers, bagels, muffins, all baked goods.   Protein foods include: Meat, fish, poultry, eggs, dairy foods, and beans such as pinto and kidney beans (beans also provide carbohydrate).   1. Eat at least 3 meals and 1-2 snacks per day. Never go more than 4-5 hours while awake without eating.  2. Limit starchy foods to TWO per meal and ONE per snack. ONE portion of a starchy         food is equal to the following:   - ONE slice of bread (or its equivalent, such as half of a hamburger bun).   - 1/2 cup of a "scoopable" starchy food such as potatoes or rice.   - 1 OUNCE of starchy snack foods such as crackers or pretzels (look on label).   - 15 grams of carbohydrate as shown on food label.  3. Both lunch and dinner should include a protein food, a carb food, and vegetables.   - Obtain twice as many veg's as protein or carbohydrate foods for both lunch and     dinner.   - Try to keep frozen veg's on hand for a quick vegetable serving.     - Fresh or frozen veg's are best.    I would like you to come back to see me in ~3 weeks so we can talk some more about your diabetes. You are going to be taking Metformin 1000mg  two times per day and are adding glipizide 5mg  with breakfast. You are starting Flomax for your urination issues. I will have a nutritionist contact you about some more teaching about diabetes.

## 2012-02-05 NOTE — Assessment & Plan Note (Signed)
For the time being I will not restart him on his Zocor. As I am starting several other medications I do not want side effects to become unclear. I will, however, plan to readdress this when I see him back in several weeks.

## 2012-03-04 ENCOUNTER — Encounter: Payer: Self-pay | Admitting: Family Medicine

## 2012-03-04 ENCOUNTER — Ambulatory Visit (INDEPENDENT_AMBULATORY_CARE_PROVIDER_SITE_OTHER): Payer: Medicaid Other | Admitting: Family Medicine

## 2012-03-04 VITALS — BP 118/82 | HR 96 | Temp 98.7°F | Ht 76.0 in | Wt 170.0 lb

## 2012-03-04 DIAGNOSIS — N400 Enlarged prostate without lower urinary tract symptoms: Secondary | ICD-10-CM

## 2012-03-04 DIAGNOSIS — E785 Hyperlipidemia, unspecified: Secondary | ICD-10-CM

## 2012-03-04 DIAGNOSIS — E119 Type 2 diabetes mellitus without complications: Secondary | ICD-10-CM

## 2012-03-04 MED ORDER — GLIPIZIDE 5 MG PO TABS: 5.0000 mg | ORAL_TABLET | Freq: Every day | ORAL | Status: DC

## 2012-03-04 MED ORDER — ATORVASTATIN CALCIUM 40 MG PO TABS: 40.0000 mg | ORAL_TABLET | Freq: Every day | ORAL | Status: DC

## 2012-03-04 MED ORDER — TAMSULOSIN HCL 0.4 MG PO CAPS: 0.4000 mg | ORAL_CAPSULE | Freq: Every day | ORAL | Status: DC

## 2012-03-04 NOTE — Assessment & Plan Note (Signed)
I will ask the patient to see our nutritionist in clinic for diabetes. Hopefully this will be more affordable for him. I've also asked him to check his blood sugar daily in the morning to have some idea if his glycemic control between menses. I am not making any changes in his diabetic medication today.

## 2012-03-04 NOTE — Patient Instructions (Signed)
It was great to see you today! I want you to start taking Lipitor for your cholesterol.  You will continue taking all your other medications. Come back to see me in about a month. I am giving you the card for our nutritionist.  Call her to set up an appointment for more help with your diet.

## 2012-03-04 NOTE — Progress Notes (Signed)
Patient ID: [name] [name], male   DOB: 08-02-1955, 57 y.o.   MRN: [id number] Subjective: The patient is a 57 y.o. year old male who presents today for followup.  1. Diabetes: Patient was informed that diabetic education class and approximately $300. He does have is too much appointment. He has not been checking his blood sugar. He is not experiencing any symptoms that sound like hypoglycemia. He is not having any problems with either his metformin and glipizide.  2. Hyperlipidemia: Patient has a history of this. He stopped taking his Zocor. We discussed the need to start him on a medication today.  3. BPH: Patient reports relief of symptoms with Flomax and is no longer having problems with decreased urinary stream.  Patient's past medical, social, and family history were reviewed and updated as appropriate. History  Substance Use Topics  . Smoking status: Current Every Day Smoker -- 30 years    Types: Cigarettes  . Smokeless tobacco: Not on file     Comment: 7 cigs a day  . Alcohol Use: 8.4 oz/week    14 Glasses of wine per week   Objective:  Filed Vitals:   03/04/12 1614  BP: 118/82  Pulse: 96  Temp: 98.7 F (37.1 C)   Gen: Thin male, no distress CV: Regular rate and rhythm, no murmurs appreciated Resp: Clear to auscultation bilaterally Ext: No edema, 2+ pulses  Assessment/Plan:  Please also see individual problems in problem list for problem-specific plans.

## 2012-03-04 NOTE — Assessment & Plan Note (Signed)
Improved with Flomax. Continue this medication. In the future we may talk about the appropriateness of screening for prostate cancer.

## 2012-03-04 NOTE — Assessment & Plan Note (Signed)
Start Lipitor. Followup in one month. Patient advised of side effects and red flags. I will plan to recheck fasting lipid panel in about 3 months to monitor for effect.

## 2012-04-16 ENCOUNTER — Ambulatory Visit: Payer: Medicaid Other | Admitting: Family Medicine

## 2012-07-23 ENCOUNTER — Other Ambulatory Visit: Payer: Self-pay | Admitting: *Deleted

## 2012-07-23 DIAGNOSIS — E119 Type 2 diabetes mellitus without complications: Secondary | ICD-10-CM

## 2012-07-23 DIAGNOSIS — N400 Enlarged prostate without lower urinary tract symptoms: Secondary | ICD-10-CM

## 2012-07-23 DIAGNOSIS — E785 Hyperlipidemia, unspecified: Secondary | ICD-10-CM

## 2012-07-23 MED ORDER — GLIPIZIDE 5 MG PO TABS: 5.0000 mg | ORAL_TABLET | Freq: Every day | ORAL | Status: DC

## 2012-07-23 MED ORDER — TAMSULOSIN HCL 0.4 MG PO CAPS: 0.4000 mg | ORAL_CAPSULE | Freq: Every day | ORAL | Status: DC

## 2012-07-29 ENCOUNTER — Ambulatory Visit: Payer: Medicaid Other | Admitting: Family Medicine

## 2012-12-28 ENCOUNTER — Other Ambulatory Visit: Payer: Self-pay | Admitting: Family Medicine

## 2012-12-28 DIAGNOSIS — N400 Enlarged prostate without lower urinary tract symptoms: Secondary | ICD-10-CM

## 2012-12-28 DIAGNOSIS — E785 Hyperlipidemia, unspecified: Secondary | ICD-10-CM

## 2012-12-28 DIAGNOSIS — E119 Type 2 diabetes mellitus without complications: Secondary | ICD-10-CM

## 2012-12-28 MED ORDER — TAMSULOSIN HCL 0.4 MG PO CAPS: 0.4000 mg | ORAL_CAPSULE | Freq: Every day | ORAL | Status: DC

## 2012-12-28 MED ORDER — GLIPIZIDE 5 MG PO TABS: 5.0000 mg | ORAL_TABLET | Freq: Every day | ORAL | Status: DC

## 2013-01-27 ENCOUNTER — Other Ambulatory Visit: Payer: Self-pay | Admitting: Family Medicine

## 2013-01-27 DIAGNOSIS — E119 Type 2 diabetes mellitus without complications: Secondary | ICD-10-CM

## 2013-01-27 MED ORDER — METFORMIN HCL 1000 MG PO TABS: 1000.0000 mg | ORAL_TABLET | Freq: Two times a day (BID) | ORAL | Status: DC

## 2013-04-13 ENCOUNTER — Ambulatory Visit: Payer: Medicaid Other | Admitting: Family Medicine

## 2013-04-16 ENCOUNTER — Ambulatory Visit (INDEPENDENT_AMBULATORY_CARE_PROVIDER_SITE_OTHER): Payer: Medicare Other | Admitting: Family Medicine

## 2013-04-16 ENCOUNTER — Encounter: Payer: Self-pay | Admitting: Family Medicine

## 2013-04-16 VITALS — BP 118/83 | HR 86 | Temp 98.2°F | Ht 76.0 in | Wt 169.0 lb

## 2013-04-16 DIAGNOSIS — M549 Dorsalgia, unspecified: Secondary | ICD-10-CM

## 2013-04-16 DIAGNOSIS — K4090 Unilateral inguinal hernia, without obstruction or gangrene, not specified as recurrent: Secondary | ICD-10-CM | POA: Insufficient documentation

## 2013-04-16 MED ORDER — TRAMADOL HCL 50 MG PO TABS: 50.0000 mg | ORAL_TABLET | Freq: Three times a day (TID) | ORAL | Status: DC | PRN

## 2013-04-16 NOTE — Assessment & Plan Note (Addendum)
Likely inguinal hernia. Will send referral for surgical evaluation. Patient is in agreement with this. Reviewed red flags for return such as fever, vomiting, worsening pain, change in color, enlarging bulge without improvement. Patient expressed understanding.
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
## 2013-04-16 NOTE — Assessment & Plan Note (Signed)
Explained that pain medicine should ideally be prescribed by his primary care doctor. However, did agree to give him a short prescription of tramadol. Strongly recommended that he be seen by Dr. Paulina FusiHess for follow up

## 2013-04-16 NOTE — Patient Instructions (Signed)

## 2013-04-21 ENCOUNTER — Encounter (INDEPENDENT_AMBULATORY_CARE_PROVIDER_SITE_OTHER): Payer: Self-pay | Admitting: General Surgery

## 2013-04-21 ENCOUNTER — Ambulatory Visit (INDEPENDENT_AMBULATORY_CARE_PROVIDER_SITE_OTHER): Payer: Medicare Other | Admitting: General Surgery

## 2013-04-21 VITALS — BP 133/84 | HR 84 | Temp 97.7°F | Resp 18 | Ht 76.0 in | Wt 167.0 lb

## 2013-04-21 DIAGNOSIS — K4090 Unilateral inguinal hernia, without obstruction or gangrene, not specified as recurrent: Secondary | ICD-10-CM

## 2013-04-21 NOTE — Progress Notes (Signed)
Patient ID: [name] [name], male   DOB: [birth year], 58 y.o.   MRN: [id number]  Chief Complaint  Patient presents with  . Hernia    HPI [name] RedoCharles W Nickelson is a 58 y.o. male.  The patient is a 58 year old male who presents today for evaluation of a left inguinal hernia. This states he has had it there for approximately 1-1/2 months. She sits becoming more symptomatic. He states itself reducible when he lies down. HPI  Past Medical History  Diagnosis Date  . Diabetes mellitus   . Genital herpes   . Arthritis   . Hyperlipidemia     History reviewed. No pertinent past surgical history.  History reviewed. No pertinent family history.  Social History History  Substance Use Topics  . Smoking status: Current Every Day Smoker -- 30 years    Types: Cigarettes  . Smokeless tobacco: Not on file     Comment: 7 cigs a day  . Alcohol Use: 8.4 oz/week    14 Glasses of wine per week    No Known Allergies  Current Outpatient Prescriptions  Medication Sig Dispense Refill  . aspirin 81 MG tablet Take 81 mg by mouth daily.      Marland Kitchen. atorvastatin (LIPITOR) 40 MG tablet Take 1 tablet (40 mg total) by mouth daily.  30 tablet  3  . glipiZIDE (GLUCOTROL) 5 MG tablet Take 1 tablet (5 mg total) by mouth daily with breakfast.  30 tablet  5  . metFORMIN (GLUCOPHAGE) 1000 MG tablet Take 1 tablet (1,000 mg total) by mouth 2 (two) times daily with a meal.  60 tablet  5  . tamsulosin (FLOMAX) 0.4 MG CAPS capsule Take 1 capsule (0.4 mg total) by mouth daily.  30 capsule  5  . traMADol (ULTRAM) 50 MG tablet Take 1 tablet (50 mg total) by mouth every 8 (eight) hours as needed.  30 tablet  0   No current facility-administered medications for this visit.    Review of Systems Review of Systems  Constitutional: Negative.   HENT: Negative.   Eyes: Negative.   Respiratory: Negative.   Cardiovascular: Negative.   Gastrointestinal: Negative.   Endocrine: Negative.   Neurological: Negative.     Blood  pressure 133/84, pulse 84, temperature 97.7 F (36.5 C), temperature source Temporal, resp. rate 18, height 6\' 4"  (1.93 m), weight 167 lb (75.751 kg).  Physical Exam Physical Exam  Constitutional: He is oriented to person, place, and time. He appears well-developed and well-nourished.  HENT:  Head: Normocephalic and atraumatic.  Eyes: Conjunctivae and EOM are normal. Pupils are equal, round, and reactive to light.  Neck: Normal range of motion. Neck supple.  Cardiovascular: Normal rate, regular rhythm and normal heart sounds.   Pulmonary/Chest: Effort normal and breath sounds normal.  Abdominal: Soft. Bowel sounds are normal. He exhibits no distension and no mass. There is no tenderness. There is no rebound and no guarding. A hernia is present. Hernia confirmed positive in the left inguinal area. Hernia confirmed negative in the right inguinal area.  Musculoskeletal: Normal range of motion.  Neurological: He is alert and oriented to person, place, and time.  Skin: Skin is warm and dry.    Data Reviewed none  Assessment    58 year old male with a left inguinal hernia     Plan    1. We'll proceed to the operating room for laparoscopic left inguinal hernia Repair with Mesh. 2. All risks and benefits were discussed with the patient, to generally include  infection, bleeding, damage to surrounding structures, acute and chronic nerve pain, and recurrence. Alternatives were offered and described.  All questions were answered and the patient voiced understanding of the procedure and wishes to proceed at this point.         Marigene Ehlers., Leiby Pigeon 04/21/2013, 4:37 PM

## 2013-04-22 ENCOUNTER — Emergency Department (HOSPITAL_COMMUNITY)
Admission: EM | Admit: 2013-04-22 | Discharge: 2013-04-22 | Disposition: A | Discharge status: Home / Self Care | Payer: Medicare Other | Attending: Emergency Medicine | Admitting: Emergency Medicine

## 2013-04-22 ENCOUNTER — Encounter (HOSPITAL_COMMUNITY): Payer: Self-pay | Admitting: Emergency Medicine

## 2013-04-22 DIAGNOSIS — Z8619 Personal history of other infectious and parasitic diseases: Secondary | ICD-10-CM | POA: Insufficient documentation

## 2013-04-22 DIAGNOSIS — K4090 Unilateral inguinal hernia, without obstruction or gangrene, not specified as recurrent: Secondary | ICD-10-CM | POA: Insufficient documentation

## 2013-04-22 DIAGNOSIS — F17200 Nicotine dependence, unspecified, uncomplicated: Secondary | ICD-10-CM | POA: Insufficient documentation

## 2013-04-22 DIAGNOSIS — R369 Urethral discharge, unspecified: Secondary | ICD-10-CM

## 2013-04-22 DIAGNOSIS — M129 Arthropathy, unspecified: Secondary | ICD-10-CM | POA: Insufficient documentation

## 2013-04-22 DIAGNOSIS — R739 Hyperglycemia, unspecified: Secondary | ICD-10-CM

## 2013-04-22 DIAGNOSIS — Z7982 Long term (current) use of aspirin: Secondary | ICD-10-CM | POA: Insufficient documentation

## 2013-04-22 DIAGNOSIS — E785 Hyperlipidemia, unspecified: Secondary | ICD-10-CM | POA: Insufficient documentation

## 2013-04-22 DIAGNOSIS — Z79899 Other long term (current) drug therapy: Secondary | ICD-10-CM | POA: Insufficient documentation

## 2013-04-22 DIAGNOSIS — E119 Type 2 diabetes mellitus without complications: Secondary | ICD-10-CM | POA: Insufficient documentation

## 2013-04-22 LAB — URINALYSIS, ROUTINE W REFLEX MICROSCOPIC
BILIRUBIN URINE: NEGATIVE
Ketones, ur: 40 mg/dL — AB
Nitrite: NEGATIVE
PROTEIN: 100 mg/dL — AB
Specific Gravity, Urine: 1.037 — ABNORMAL HIGH (ref 1.005–1.030)
Urobilinogen, UA: 0.2 mg/dL (ref 0.0–1.0)
pH: 6 (ref 5.0–8.0)

## 2013-04-22 LAB — CBC WITH DIFFERENTIAL/PLATELET
BASOS ABS: 0 10*3/uL (ref 0.0–0.1)
Basophils Relative: 0 % (ref 0–1)
EOS PCT: 1 % (ref 0–5)
Eosinophils Absolute: 0.1 10*3/uL (ref 0.0–0.7)
HCT: 42.7 % (ref 39.0–52.0)
Hemoglobin: 15.5 g/dL (ref 13.0–17.0)
Lymphocytes Relative: 11 % — ABNORMAL LOW (ref 12–46)
Lymphs Abs: 1.8 10*3/uL (ref 0.7–4.0)
MCH: 31.1 pg (ref 26.0–34.0)
MCHC: 36.3 g/dL — ABNORMAL HIGH (ref 30.0–36.0)
MCV: 85.6 fL (ref 78.0–100.0)
Monocytes Absolute: 1.3 10*3/uL — ABNORMAL HIGH (ref 0.1–1.0)
Monocytes Relative: 8 % (ref 3–12)
Neutro Abs: 13.6 10*3/uL — ABNORMAL HIGH (ref 1.7–7.7)
Neutrophils Relative %: 81 % — ABNORMAL HIGH (ref 43–77)
Platelets: 319 10*3/uL (ref 150–400)
RBC: 4.99 MIL/uL (ref 4.22–5.81)
RDW: 13.1 % (ref 11.5–15.5)
WBC: 16.8 10*3/uL — ABNORMAL HIGH (ref 4.0–10.5)

## 2013-04-22 LAB — BASIC METABOLIC PANEL
BUN: 18 mg/dL (ref 6–23)
CALCIUM: 10.1 mg/dL (ref 8.4–10.5)
CO2: 22 mEq/L (ref 19–32)
CREATININE: 0.84 mg/dL (ref 0.50–1.35)
Chloride: 94 mEq/L — ABNORMAL LOW (ref 96–112)
GFR calc Af Amer: >90 mL/min (ref >90)
GFR calc non Af Amer: >90 mL/min (ref >90)
GLUCOSE: 437 mg/dL — AB (ref 70–99)
Potassium: 4.4 mEq/L (ref 3.7–5.3)
Sodium: 134 mEq/L — ABNORMAL LOW (ref 137–147)

## 2013-04-22 LAB — URINE MICROSCOPIC-ADD ON

## 2013-04-22 LAB — CBG MONITORING, ED
GLUCOSE-CAPILLARY: 288 mg/dL — AB (ref 70–99)
Glucose-Capillary: 373 mg/dL — ABNORMAL HIGH (ref 70–99)

## 2013-04-22 MED ORDER — HYDROCODONE-ACETAMINOPHEN 5-325 MG PO TABS: 1.0000 | ORAL_TABLET | Freq: Four times a day (QID) | ORAL | Status: DC | PRN

## 2013-04-22 MED ORDER — SODIUM CHLORIDE 0.9 % IV BOLUS (SEPSIS)
1000.0000 mL | Freq: Once | INTRAVENOUS | Status: AC
  Administered 2013-04-22: 1000 mL via INTRAVENOUS

## 2013-04-22 MED ORDER — CEFTRIAXONE SODIUM 250 MG IJ SOLR: 250.0000 mg | Freq: Once | INTRAMUSCULAR | Status: DC

## 2013-04-22 MED ORDER — AZITHROMYCIN 250 MG PO TABS
1000.0000 mg | ORAL_TABLET | Freq: Once | ORAL | Status: AC
  Administered 2013-04-22: 1000 mg via ORAL
  Filled 2013-04-22: qty 4

## 2013-04-22 MED ORDER — INSULIN ASPART 100 UNIT/ML ~~LOC~~ SOLN: 10.0000 [IU] | Freq: Once | SUBCUTANEOUS | Status: DC

## 2013-04-22 MED ORDER — ONDANSETRON HCL 4 MG/2ML IJ SOLN
4.0000 mg | Freq: Once | INTRAMUSCULAR | Status: AC
  Administered 2013-04-22: 4 mg via INTRAVENOUS
  Filled 2013-04-22: qty 2

## 2013-04-22 MED ORDER — DEXTROSE 5 % IV SOLN
250.0000 mg | Freq: Once | INTRAVENOUS | Status: AC
  Administered 2013-04-22: 250 mg via INTRAVENOUS
  Filled 2013-04-22: qty 250

## 2013-04-22 MED ORDER — MORPHINE SULFATE 4 MG/ML IJ SOLN
4.0000 mg | Freq: Once | INTRAMUSCULAR | Status: AC
  Administered 2013-04-22: 4 mg via INTRAVENOUS
  Filled 2013-04-22: qty 1

## 2013-04-22 NOTE — ED Provider Notes (Signed)
Patient [facility] in the Emergency Department [facility] Patient reports left inguinal hernia/pain Exam : awake/alert, abd soft, hernia has been reduced Plan: labs pending but I anticipate d/c home I doubt any other acute abdominal process at this time    Joya Gaskinsonald W Ayriana Wix, MD 04/22/13 [phone]

## 2013-04-22 NOTE — ED Notes (Addendum)
Pt presents with intermittent Left inguinal hernia pain x1 week with increase pain last night, pt also reports a yellow penile discharge this am. Pt states he was at [facility] yesterday, they are to call him back to set up his surgery for March 31st.

## 2013-04-22 NOTE — Discharge Instructions (Signed)
Hernia A hernia occurs when an internal organ pushes out through a weak spot in the abdominal wall. Hernias most commonly occur in the groin and around the navel. Hernias often can be pushed back into place (reduced). Most hernias tend to get worse over time. Some abdominal hernias can get stuck in the opening (irreducible or incarcerated hernia) and cannot be reduced. An irreducible abdominal hernia which is tightly squeezed into the opening is at risk for impaired blood supply (strangulated hernia). A strangulated hernia is a medical emergency. Because of the risk for an irreducible or strangulated hernia, surgery may be recommended to repair a hernia. CAUSES   Heavy lifting.  Prolonged coughing.  Straining to have a bowel movement.  A cut (incision) made during an abdominal surgery. HOME CARE INSTRUCTIONS   Bed rest is not required. You may continue your normal activities.  Avoid lifting more than 10 pounds (4.5 kg) or straining.  Cough gently. If you are a smoker it is best to stop. Even the best hernia repair can break down with the continual strain of coughing. Even if you do not have your hernia repaired, a cough will continue to aggravate the problem.  Do not wear anything tight over your hernia. Do not try to keep it in with an outside bandage or truss. These can damage abdominal contents if they are trapped within the hernia sac.  Eat a normal diet.  Avoid constipation. Straining over long periods of time will increase hernia size and encourage breakdown of repairs. If you cannot do this with diet alone, stool softeners may be used. SEEK IMMEDIATE MEDICAL CARE IF:   You have a fever.  You develop increasing abdominal pain.  You feel nauseous or vomit.  Your hernia is stuck outside the abdomen, looks discolored, feels hard, or is tender.  You have any changes in your bowel habits or in the hernia that are unusual for you.  You have increased pain or swelling around the  hernia.  You cannot push the hernia back in place by applying gentle pressure while lying down. MAKE SURE YOU:   Understand these instructions.  Will watch your condition.  Will get help right away if you are not doing well or get worse. Document Released: 02/04/2005 Document Revised: 04/29/2011 Document Reviewed: 09/24/2007 [facility] Patient Information 2014 [place], [place]. Sexually Transmitted Disease A sexually transmitted disease (STD) is a disease or infection that may be passed (transmitted) from person to person, usually during sexual activity. This may happen by way of saliva, semen, blood, vaginal mucus, or urine. Common STDs include:   Gonorrhea.   Chlamydia.   Syphilis.   HIV and AIDS.   Genital herpes.   Hepatitis B and C.   Trichomonas.   Human papillomavirus (HPV).   Pubic lice.   Scabies.  Mites.  Bacterial vaginosis. WHAT ARE CAUSES OF STDs? An STD may be caused by bacteria, a virus, or parasites. STDs are often transmitted during sexual activity if one person is infected. However, they may also be transmitted through nonsexual means. STDs may be transmitted after:   Sexual intercourse with an infected person.   Sharing sex toys with an infected person.   Sharing needles with an infected person or using unclean piercing or tattoo needles.  Having intimate contact with the genitals, mouth, or rectal areas of an infected person.   Exposure to infected fluids during birth. WHAT ARE THE SIGNS AND SYMPTOMS OF STDs? Different STDs have different symptoms. Some people may not  have any symptoms. If symptoms are present, they may include:   Painful or bloody urination.   Pain in the pelvis, abdomen, vagina, anus, throat, or eyes.   Skin rash, itching, irritation, growths, sores (lesions), ulcerations, or warts in the genital or anal area.  Abnormal vaginal discharge with or without bad odor.   Penile discharge in men.   Fever.    Pain or bleeding during sexual intercourse.   Swollen glands in the groin area.   Yellow skin and eyes (jaundice). This is seen with hepatitis.   Swollen testicles.  Infertility.  Sores and blisters in the mouth. HOW ARE STDs DIAGNOSED? To make a diagnosis, your health care provider may:   Take a medical history.   Perform a physical exam.   Take a sample of any discharge for examination.  Swab the throat, cervix, opening to the penis, rectum, or vagina for testing.  Test a sample of your first morning urine.   Perform blood tests.   Perform a Pap smear, if this applies.   Perform a colposcopy.   Perform a laparoscopy.  HOW ARE STDs TREATED? Treatment depends on the STD. Some STDs may be treated but not cured.   Chlamydia, gonorrhea, trichomonas, and syphilis can be cured with antibiotics.   Genital herpes, hepatitis, and HIV can be treated, but not cured, with prescribed medicines. The medicines lessen symptoms.   Genital warts from HPV can be treated with medicine or by freezing, burning (electrocautery), or surgery. Warts may come back.   HPV cannot be cured with medicine or surgery. However, abnormal areas may be removed from the cervix, vagina, or vulva.   If your diagnosis is confirmed, your recent sexual partners need treatment. This is true even if they are symptom-free or have a negative culture or evaluation. They should not have sex until their health care providers say it is OK. HOW CAN I REDUCE MY RISK OF GETTING AN STD?  Use latex condoms, dental dams, and water-soluble lubricants during sexual activity. Do not use petroleum jelly or oils.  Get vaccinated for HPV and hepatitis. If you have not received these vaccines in the past, talk to your health care provider about whether one or both might be right for you.   Avoid risky sex practices that can break the skin.  WHAT SHOULD I DO IF I THINK I HAVE AN STD?  See your health care  provider.   Inform all sexual partners. They should be tested and treated for any STDs.  Do not have sex until your health care provider says it is OK. WHEN SHOULD I GET HELP? Seek immediate medical care if:  You develop severe abdominal pain.  You are a man and notice swelling or pain in the testicles.  You are a woman and notice swelling or pain in your vagina. Document Released: 04/27/2002 Document Revised: 11/25/2012 Document Reviewed: 08/25/2012 [facility] Patient Information 2014 [place], [place].

## 2013-04-22 NOTE — ED Provider Notes (Signed)
CSN: [id number]     Arrival date & time 04/22/13  0746 History   First MD Initiated Contact with Patient 04/22/13 [phone]     Chief Complaint  Patient presents with  . Inguinal Hernia     (Consider location/radiation/quality/duration/timing/severity/associated sxs/prior Treatment) HPI Comments: Patient presents emergency department with chief complaint of left-sided inguinal hernia and associated pain, nausea and vomiting. He states that he has had the hernia for the past month and a half or so. He was seen yesterday by general surgery in the office, and was advised to have hernia repair performed later this month. He was prescribed tramadol, which she states is not working. Additionally, he complains of dysuria and penile discharge. He denies fever. Denies changes in bowel movements. He states that his nausea and vomiting have been ongoing for the past 4 days.  The history is provided by the patient. No language interpreter was used.    Past Medical History  Diagnosis Date  . Diabetes mellitus   . Genital herpes   . Arthritis   . Hyperlipidemia    History reviewed. No pertinent past surgical history. History reviewed. No pertinent family history. History  Substance Use Topics  . Smoking status: Current Every Day Smoker -- 30 years    Types: Cigarettes  . Smokeless tobacco: Not on file     Comment: 7 cigs a day  . Alcohol Use: 8.4 oz/week    14 Glasses of wine per week    Review of Systems  Constitutional: Negative for fever and chills.  Respiratory: Negative for shortness of breath.   Cardiovascular: Negative for chest pain.  Gastrointestinal: Positive for abdominal pain. Negative for nausea, vomiting, diarrhea and constipation.  Genitourinary: Positive for dysuria and discharge.      Allergies  Tramadol  Home Medications   Current Outpatient Rx  Name  Route  Sig  Dispense  Refill  . aspirin 81 MG tablet   Oral   Take 81 mg by mouth daily.         Marland Kitchen atorvastatin  (LIPITOR) 40 MG tablet   Oral   Take 1 tablet (40 mg total) by mouth daily.   30 tablet   3   . glipiZIDE (GLUCOTROL) 5 MG tablet   Oral   Take 1 tablet (5 mg total) by mouth daily with breakfast.   30 tablet   5   . metFORMIN (GLUCOPHAGE) 1000 MG tablet   Oral   Take 1 tablet (1,000 mg total) by mouth 2 (two) times daily with a meal.   60 tablet   5   . tamsulosin (FLOMAX) 0.4 MG CAPS capsule   Oral   Take 1 capsule (0.4 mg total) by mouth daily.   30 capsule   5   . traMADol (ULTRAM) 50 MG tablet   Oral   Take 1 tablet (50 mg total) by mouth every 8 (eight) hours as needed.   30 tablet   0    BP 144/93  Pulse 83  Temp(Src) 98.1 F (36.7 C) (Oral)  Resp 22  Ht 6\' 4"  (1.93 m)  Wt 167 lb (75.751 kg)  BMI 20.34 kg/m2  SpO2 98% Physical Exam  Nursing note and vitals reviewed. Constitutional: He is oriented to person, place, and time. He appears well-developed and well-nourished.  HENT:  Head: Normocephalic and atraumatic.  Eyes: Conjunctivae and EOM are normal. Pupils are equal, round, and reactive to light. Right eye exhibits no discharge. Left eye exhibits no discharge. No scleral  icterus.  Neck: Normal range of motion. Neck supple. No JVD present.  Cardiovascular: Normal rate, regular rhythm and normal heart sounds.  Exam reveals no gallop and no friction rub.   No murmur heard. Pulmonary/Chest: Effort normal and breath sounds normal. No respiratory distress. He has no wheezes. He has no rales. He exhibits no tenderness.  Abdominal: Soft. He exhibits no distension and no mass. There is no tenderness. There is no rebound and no guarding.  Left sided inguinal hernia, reducible with palpation while lying supine  Genitourinary:  Uncircumcised, Flow penile discharge  Musculoskeletal: Normal range of motion. He exhibits no edema and no tenderness.  Neurological: He is alert and oriented to person, place, and time.  Skin: Skin is warm and dry.  Psychiatric: He has a  normal mood and affect. His behavior is normal. Judgment and thought content normal.    ED Course  Procedures (including critical care time) Results for orders placed during the hospital encounter of 04/22/13  URINALYSIS, ROUTINE W REFLEX MICROSCOPIC      Result Value Ref Range   Color, Urine YELLOW  YELLOW   APPearance TURBID (*) CLEAR   Specific Gravity, Urine 1.037 (*) 1.005 - 1.030   pH 6.0  5.0 - 8.0   Glucose, UA >1000 (*) NEGATIVE mg/dL   Hgb urine dipstick LARGE (*) NEGATIVE   Bilirubin Urine NEGATIVE  NEGATIVE   Ketones, ur 40 (*) NEGATIVE mg/dL   Protein, ur 161100 (*) NEGATIVE mg/dL   Urobilinogen, UA 0.2  0.0 - 1.0 mg/dL   Nitrite NEGATIVE  NEGATIVE   Leukocytes, UA MODERATE (*) NEGATIVE  CBC WITH DIFFERENTIAL      Result Value Ref Range   WBC 16.8 (*) 4.0 - 10.5 K/uL   RBC 4.99  4.22 - 5.81 MIL/uL   Hemoglobin 15.5  13.0 - 17.0 g/dL   HCT 09.642.7  04.539.0 - 40.952.0 %   MCV 85.6  78.0 - 100.0 fL   MCH 31.1  26.0 - 34.0 pg   MCHC 36.3 (*) 30.0 - 36.0 g/dL   RDW 81.113.1  91.411.5 - 78.215.5 %   Platelets 319  150 - 400 K/uL   Neutrophils Relative % 81 (*) 43 - 77 %   Neutro Abs 13.6 (*) 1.7 - 7.7 K/uL   Lymphocytes Relative 11 (*) 12 - 46 %   Lymphs Abs 1.8  0.7 - 4.0 K/uL   Monocytes Relative 8  3 - 12 %   Monocytes Absolute 1.3 (*) 0.1 - 1.0 K/uL   Eosinophils Relative 1  0 - 5 %   Eosinophils Absolute 0.1  0.0 - 0.7 K/uL   Basophils Relative 0  0 - 1 %   Basophils Absolute 0.0  0.0 - 0.1 K/uL  BASIC METABOLIC PANEL      Result Value Ref Range   Sodium 134 (*) 137 - 147 mEq/L   Potassium 4.4  3.7 - 5.3 mEq/L   Chloride 94 (*) 96 - 112 mEq/L   CO2 22  19 - 32 mEq/L   Glucose, Bld 437 (*) 70 - 99 mg/dL   BUN 18  6 - 23 mg/dL   Creatinine, Ser 9.560.84  0.50 - 1.35 mg/dL   Calcium 21.310.1  8.4 - 08.610.5 mg/dL   GFR calc non Af Amer >90  >90 mL/min   GFR calc Af Amer >90  >90 mL/min  URINE MICROSCOPIC-ADD ON      Result Value Ref Range   WBC, UA 21-50  <3  WBC/hpf   RBC / HPF 11-20  <3  RBC/hpf   Bacteria, UA MANY (*) RARE  CBG MONITORING, ED      Result Value Ref Range   Glucose-Capillary 373 (*) 70 - 99 mg/dL  CBG MONITORING, ED      Result Value Ref Range   Glucose-Capillary 288 (*) 70 - 99 mg/dL   No results found.    EKG Interpretation None      MDM   Final diagnoses:  Inguinal hernia  Hyperglycemia  Penile discharge    Patient with inguinal hernia.  Also complains of dysuria and penile discharge.  Will check labs, UA, and GC.  Patient was seen by surgery last night, who recommend surgery later this month.  Will manage patient's pain.  Anticipate discharge.  Patient seen by and discussed with Dr. Bebe Shaggy.  Patient does have [place] penile discharge. I will cover for gonorrhea Chlamydia, and will give the patient Rocephin and azithromycin.  9:35 AM Patient reassessed. He states his pain is improved.  Patient seen by and discussed with Dr. Bebe Shaggy, who agrees that the patient can be discharged to home.  Patient is hyperglycemic. I've given him several liters of fluid. His sugars down to 288. He is requesting to go home. He tells me that he did not take his diabetes medication this morning. I will discharge to home, and recommend followup with surgery for his inguinal hernia. This reduces easily. He is stable and ready for discharge.  Roxy Horseman, PA-C 04/22/13 1252

## 2013-04-23 NOTE — ED Provider Notes (Signed)
Medical screening examination/treatment/procedure(s) were conducted as a shared visit with non-physician practitioner(s) and myself.  I personally evaluated the patient during the encounter.   EKG Interpretation None        Joya Gaskinsonald W Keiana Tavella, MD 04/23/13 [phone]

## 2013-05-04 ENCOUNTER — Encounter: Payer: Self-pay | Admitting: Family Medicine

## 2013-05-04 ENCOUNTER — Ambulatory Visit (INDEPENDENT_AMBULATORY_CARE_PROVIDER_SITE_OTHER): Payer: Medicare Other | Admitting: Family Medicine

## 2013-05-04 VITALS — BP 115/80 | HR 90 | Ht 76.0 in | Wt 166.7 lb

## 2013-05-04 DIAGNOSIS — K4090 Unilateral inguinal hernia, without obstruction or gangrene, not specified as recurrent: Secondary | ICD-10-CM

## 2013-05-04 DIAGNOSIS — E785 Hyperlipidemia, unspecified: Secondary | ICD-10-CM

## 2013-05-04 DIAGNOSIS — E119 Type 2 diabetes mellitus without complications: Secondary | ICD-10-CM

## 2013-05-04 DIAGNOSIS — F17200 Nicotine dependence, unspecified, uncomplicated: Secondary | ICD-10-CM

## 2013-05-04 LAB — POCT GLYCOSYLATED HEMOGLOBIN (HGB A1C): HEMOGLOBIN A1C: 11.2

## 2013-05-04 MED ORDER — HYDROCODONE-ACETAMINOPHEN 5-325 MG PO TABS: 1.0000 | ORAL_TABLET | Freq: Four times a day (QID) | ORAL | Status: DC | PRN

## 2013-05-04 MED ORDER — GLIMEPIRIDE 4 MG PO TABS: 4.0000 mg | ORAL_TABLET | Freq: Every day | ORAL | Status: DC

## 2013-05-04 MED ORDER — METFORMIN HCL 1000 MG PO TABS: 1000.0000 mg | ORAL_TABLET | Freq: Two times a day (BID) | ORAL | Status: DC

## 2013-05-04 NOTE — Assessment & Plan Note (Addendum)
A1C to 11.2 today.  Increase in Metformin 1000 mg BID q meal and switch to amaryl.  Will see back in about 1-2 weeks prior to surgery.  As well, order in and recommend Micro:Crea ratio.  Diet and exercises explained to patient extensively.  Consider UA to evaluate hematuria and pyuria from previous UA.

## 2013-05-04 NOTE — Assessment & Plan Note (Signed)
Will consider Lipid Profile in pt, may need restarted on statin.

## 2013-05-04 NOTE — Progress Notes (Signed)
[name] [name] is a 58 y.o. male who presents today for DM, Inguinal Hernia, hyperlipidemia, and smoking.  Smoking < 1/2 ppd, ready to quit, especially with surgery coming up.   Inguinal Hernia - Surgical treatment on 3/27, pain controlled with vicodin, denies any abdominal pain with eating or at rest, nausea vomiting, diarrhea.    DM II - States compliance with Glipizide and metformin, no changes in diet or exercises over the last yr.   Hyperlipidemia - Not taking lipitor.   Past Medical History  Diagnosis Date  . Diabetes mellitus   . Genital herpes   . Arthritis   . Hyperlipidemia     History  Smoking status  . Current Every Day Smoker -- 30 years  . Types: Cigarettes  Smokeless tobacco  . Not on file    Comment: 7 cigs a day    No family history on file.  Current Outpatient Prescriptions on File Prior to Visit  Medication Sig Dispense Refill  . aspirin 81 MG tablet Take 81 mg by mouth daily.      Marland Kitchen. glipiZIDE (GLUCOTROL) 5 MG tablet Take 1 tablet (5 mg total) by mouth daily with breakfast.  30 tablet  5  . HYDROcodone-acetaminophen (NORCO/VICODIN) 5-325 MG per tablet Take 1-2 tablets by mouth every 6 (six) hours as needed.  15 tablet  0  . metFORMIN (GLUCOPHAGE) 1000 MG tablet Take 1,000 mg by mouth daily with breakfast.      . tamsulosin (FLOMAX) 0.4 MG CAPS capsule Take 1 capsule (0.4 mg total) by mouth daily.  30 capsule  5   No current facility-administered medications on file prior to visit.    ROS: Per HPI.  All other systems reviewed and are negative.   Physical Exam Filed Vitals:   05/04/13 1553  BP: 115/80  Pulse: 90    Physical Examination: General appearance - alert, well appearing, and in no distress Chest - clear to auscultation, no wheezes, rales or rhonchi, symmetric air entry Heart - normal rate and regular rhythm, no murmurs noted Extremities - peripheral pulses normal, no pedal edema, no clubbing or cyanosis Skin - normal coloration and  turgor, no rashes, no suspicious skin lesions noted    Chemistry      Component Value Date/Time   NA 134* 04/22/2013 0840   K 4.4 04/22/2013 0840   CL 94* 04/22/2013 0840   CO2 22 04/22/2013 0840   BUN 18 04/22/2013 0840   CREATININE 0.84 04/22/2013 0840   CREATININE 0.99 08/01/2010 1659      Component Value Date/Time   CALCIUM 10.1 04/22/2013 0840   ALKPHOS 86 09/23/2010 1931   AST 11 09/23/2010 1931   ALT 10 09/23/2010 1931   BILITOT 0.4 09/23/2010 1931      Lab Results  Component Value Date   HGBA1C 11.2 05/04/2013

## 2013-05-04 NOTE — Patient Instructions (Signed)
Mr. [name] [name], it was nice seeing you today.  Please come back at your convenience to have your cholesterol taken when you have not been eating for about 8 hours and to urinate in a cup for us.  We will see you back in about 7-10 days.  Thanks, Dr. Paulina FusiHess

## 2013-05-04 NOTE — Assessment & Plan Note (Signed)
Counseling discussed at length with pt.  Offered smoking cessation resources and evaluation by Dr. Raymondo BandKoval.  Will consider.

## 2013-05-04 NOTE — Assessment & Plan Note (Signed)
Surgery by Dr. Ardyth HarpsHernandez on 3/27, minimal vicodin to tide over until surgical treatment.

## 2013-05-05 NOTE — Addendum Note (Signed)
Addended by: Jennette BillBUSICK, ROBERT L on: 05/05/2013 08:40 AM   Modules accepted: Orders

## 2013-05-11 ENCOUNTER — Other Ambulatory Visit: Payer: Medicare Other

## 2013-05-11 DIAGNOSIS — E119 Type 2 diabetes mellitus without complications: Secondary | ICD-10-CM

## 2013-05-11 DIAGNOSIS — E785 Hyperlipidemia, unspecified: Secondary | ICD-10-CM

## 2013-05-11 NOTE — Progress Notes (Signed)
FLP AND MICRO/CREAT RATIO URINE DONE TODAY Bret Stamour

## 2013-05-12 LAB — LIPID PANEL
Cholesterol: 217 mg/dL — ABNORMAL HIGH (ref 0–200)
HDL: 44 mg/dL (ref >39)
LDL Cholesterol: 126 mg/dL — ABNORMAL HIGH (ref 0–99)
Total CHOL/HDL Ratio: 4.9 Ratio
Triglycerides: 235 mg/dL — ABNORMAL HIGH (ref <150)
VLDL: 47 mg/dL — ABNORMAL HIGH (ref 0–40)

## 2013-05-12 LAB — MICROALBUMIN / CREATININE URINE RATIO
CREATININE, URINE: 145.1 mg/dL
MICROALB/CREAT RATIO: 43 mg/g — AB (ref 0.0–30.0)
Microalb, Ur: 6.24 mg/dL — ABNORMAL HIGH (ref 0.00–1.89)

## 2013-05-18 ENCOUNTER — Other Ambulatory Visit (INDEPENDENT_AMBULATORY_CARE_PROVIDER_SITE_OTHER): Payer: Self-pay

## 2013-05-18 DIAGNOSIS — K4090 Unilateral inguinal hernia, without obstruction or gangrene, not specified as recurrent: Secondary | ICD-10-CM

## 2013-05-18 MED ORDER — OXYCODONE-ACETAMINOPHEN 5-325 MG PO TABS: 1.0000 | ORAL_TABLET | ORAL | Status: DC | PRN

## 2013-06-01 ENCOUNTER — Encounter (INDEPENDENT_AMBULATORY_CARE_PROVIDER_SITE_OTHER): Payer: Self-pay | Admitting: General Surgery

## 2013-06-01 ENCOUNTER — Ambulatory Visit (INDEPENDENT_AMBULATORY_CARE_PROVIDER_SITE_OTHER): Payer: Medicare Other | Admitting: General Surgery

## 2013-06-01 VITALS — BP 120/75 | HR 87 | Temp 97.4°F | Resp 14 | Ht 76.0 in | Wt 168.8 lb

## 2013-06-01 DIAGNOSIS — Z9889 Other specified postprocedural states: Secondary | ICD-10-CM

## 2013-06-01 MED ORDER — OXYCODONE-ACETAMINOPHEN 5-325 MG PO TABS: 1.0000 | ORAL_TABLET | ORAL | Status: DC | PRN

## 2013-06-01 NOTE — Progress Notes (Signed)
Patient ID: [name] RedoCharles W [name], male   DOB: November 21, 1955, 58 y.o.   MRN: [id number] Post op course The patient is a 58 year old male status post laparoscopic left inguinal or apparent mass. Patient has been doing well postoperatively. He complains of some soreness in his left inguinal area. Patient continued with angulated activity as tolerated.  On Exam: Wounds are clean dry intact, there is no hernia on palpation   Assessment and Plan 58 year old male status post laparoscopic left inguinal hernia repair with mesh 1. Under the patient prescription for Percocet 5-25 # 20. 2. We discussed with the lifting restrictions for another 2-3 weeks. 3. Patient follow up as needed   Axel FillerArmando Ramirez, MD [facility] General [facility]ally Invasive Surgery Trauma & Emergency Surgery  \

## 2013-07-06 ENCOUNTER — Other Ambulatory Visit: Payer: Self-pay | Admitting: *Deleted

## 2013-07-06 DIAGNOSIS — E119 Type 2 diabetes mellitus without complications: Secondary | ICD-10-CM

## 2013-07-06 DIAGNOSIS — N400 Enlarged prostate without lower urinary tract symptoms: Secondary | ICD-10-CM

## 2013-07-06 DIAGNOSIS — E785 Hyperlipidemia, unspecified: Secondary | ICD-10-CM

## 2013-07-06 MED ORDER — TAMSULOSIN HCL 0.4 MG PO CAPS: 0.4000 mg | ORAL_CAPSULE | Freq: Every day | ORAL | Status: DC

## 2013-07-13 ENCOUNTER — Other Ambulatory Visit: Payer: Self-pay | Admitting: *Deleted

## 2013-07-13 DIAGNOSIS — E119 Type 2 diabetes mellitus without complications: Secondary | ICD-10-CM

## 2013-07-13 DIAGNOSIS — N400 Enlarged prostate without lower urinary tract symptoms: Secondary | ICD-10-CM

## 2013-07-13 DIAGNOSIS — E785 Hyperlipidemia, unspecified: Secondary | ICD-10-CM

## 2013-07-13 MED ORDER — TAMSULOSIN HCL 0.4 MG PO CAPS: 0.4000 mg | ORAL_CAPSULE | Freq: Every day | ORAL | Status: AC

## 2013-09-30 ENCOUNTER — Ambulatory Visit: Payer: Medicare Other | Admitting: Family Medicine

## 2013-11-23 ENCOUNTER — Emergency Department (HOSPITAL_COMMUNITY)
Admission: EM | Admit: 2013-11-23 | Discharge: 2013-11-23 | Disposition: A | Discharge status: Home / Self Care | Payer: Medicare Other | Attending: Emergency Medicine | Admitting: Emergency Medicine

## 2013-11-23 ENCOUNTER — Emergency Department (HOSPITAL_COMMUNITY): Payer: Medicare Other

## 2013-11-23 ENCOUNTER — Encounter (HOSPITAL_COMMUNITY): Payer: Self-pay | Admitting: Emergency Medicine

## 2013-11-23 DIAGNOSIS — N433 Hydrocele, unspecified: Secondary | ICD-10-CM | POA: Insufficient documentation

## 2013-11-23 DIAGNOSIS — Z8619 Personal history of other infectious and parasitic diseases: Secondary | ICD-10-CM | POA: Diagnosis not present

## 2013-11-23 DIAGNOSIS — E119 Type 2 diabetes mellitus without complications: Secondary | ICD-10-CM | POA: Diagnosis not present

## 2013-11-23 DIAGNOSIS — Z720 Tobacco use: Secondary | ICD-10-CM | POA: Diagnosis not present

## 2013-11-23 DIAGNOSIS — N50819 Testicular pain, unspecified: Secondary | ICD-10-CM

## 2013-11-23 DIAGNOSIS — N509 Disorder of male genital organs, unspecified: Secondary | ICD-10-CM | POA: Diagnosis present

## 2013-11-23 DIAGNOSIS — Z79899 Other long term (current) drug therapy: Secondary | ICD-10-CM | POA: Diagnosis not present

## 2013-11-23 DIAGNOSIS — N503 Cyst of epididymis: Secondary | ICD-10-CM | POA: Diagnosis not present

## 2013-11-23 DIAGNOSIS — Z7982 Long term (current) use of aspirin: Secondary | ICD-10-CM | POA: Diagnosis not present

## 2013-11-23 DIAGNOSIS — M1990 Unspecified osteoarthritis, unspecified site: Secondary | ICD-10-CM | POA: Insufficient documentation

## 2013-11-23 LAB — BASIC METABOLIC PANEL
Anion gap: 15 (ref 5–15)
BUN: 12 mg/dL (ref 6–23)
CO2: 22 mEq/L (ref 19–32)
Calcium: 9.1 mg/dL (ref 8.4–10.5)
Chloride: 100 mEq/L (ref 96–112)
Creatinine, Ser: 0.83 mg/dL (ref 0.50–1.35)
GFR calc Af Amer: >90 mL/min (ref >90)
GFR calc non Af Amer: >90 mL/min (ref >90)
Glucose, Bld: 205 mg/dL — ABNORMAL HIGH (ref 70–99)
Potassium: 4 mEq/L (ref 3.7–5.3)
Sodium: 137 mEq/L (ref 137–147)

## 2013-11-23 LAB — CBC
HCT: 42 % (ref 39.0–52.0)
Hemoglobin: 14.5 g/dL (ref 13.0–17.0)
MCH: 29.3 pg (ref 26.0–34.0)
MCHC: 34.5 g/dL (ref 30.0–36.0)
MCV: 84.8 fL (ref 78.0–100.0)
Platelets: 336 10*3/uL (ref 150–400)
RBC: 4.95 MIL/uL (ref 4.22–5.81)
RDW: 13.9 % (ref 11.5–15.5)
WBC: 17.1 10*3/uL — ABNORMAL HIGH (ref 4.0–10.5)

## 2013-11-23 LAB — URINALYSIS, ROUTINE W REFLEX MICROSCOPIC
Bilirubin Urine: NEGATIVE
Glucose, UA: 250 mg/dL — AB
Ketones, ur: NEGATIVE mg/dL
Nitrite: NEGATIVE
Protein, ur: NEGATIVE mg/dL
Specific Gravity, Urine: 1.017 (ref 1.005–1.030)
Urobilinogen, UA: 0.2 mg/dL (ref 0.0–1.0)
pH: 5.5 (ref 5.0–8.0)

## 2013-11-23 LAB — URINE MICROSCOPIC-ADD ON

## 2013-11-23 MED ORDER — HYDROCODONE-ACETAMINOPHEN 5-325 MG PO TABS: 1.0000 | ORAL_TABLET | Freq: Four times a day (QID) | ORAL | Status: DC | PRN

## 2013-11-23 MED ORDER — HYDROCODONE-ACETAMINOPHEN 5-325 MG PO TABS
2.0000 | ORAL_TABLET | Freq: Once | ORAL | Status: AC
  Administered 2013-11-23: 2 via ORAL
  Filled 2013-11-23: qty 2

## 2013-11-23 MED ORDER — CIPROFLOXACIN HCL 500 MG PO TABS: 500.0000 mg | ORAL_TABLET | Freq: Two times a day (BID) | ORAL | Status: DC

## 2013-11-23 NOTE — Discharge Instructions (Signed)
Hydrocele, Adult Fluid can collect around the testicles. This fluid forms in a sac. This condition is called a hydrocele. The collected fluid causes swelling of the scrotum. Usually, it affects just one testicle. Most of the time, the condition does not cause pain. Sometimes, the hydrocele goes away on its own. Other times, surgery is needed to get rid of the fluid. CAUSES A hydrocele does not develop often. Different things can cause a hydrocele in a man, including:  Injury to the scrotum.  Infection.  X-ray of the area around the scrotum.  A tumor or cancer of the testicle.  Twisting of a testicle.  Decreased blood flow to the scrotum. SYMPTOMS   Swelling without pain. The hydrocele feels like a water-filled balloon.  Swelling with pain. This can occur if the hydrocele was caused by infection or twisting.  Mild discomfort in the scrotum.  The hydrocele may feel heavy.  Swelling that gets smaller when you lie down. DIAGNOSIS  Your caregiver will do a physical exam to decide if you have a hydrocele. This may include:  Asking questions about your overall health, today and in the past. Your caregiver may ask about any injuries, X-rays, or infections.  Pushing on your abdomen or asking you to change positions to see if the size of the hydrocele changes.  Shining a light through the scrotum (transillumination) to see if the fluid inside the scrotum is clear.  Blood tests and urine tests to check for infection.  Imaging studies that take pictures of the scrotum and testicles. TREATMENT  Treatment depends in part on what caused the condition. Options include:  Watchful waiting. Your caregiver checks the hydrocele every so often.  Different surgeries to drain the fluid.  A needle may be put into the scrotum to drain fluid (needle aspiration). Fluid often returns after this type of treatment.  A cut (incision) may be made in the scrotum to remove the fluid sac  (hydrocelectomy).  An incision may be made in the groin to repair a hydrocele that has contact with abdominal fluids (communicating hydrocele).  Medicines to treat an infection (antibiotics). HOME CARE INSTRUCTIONS  What you need to do at home may depend on the cause of the hydrocele and type of treatment. In general:  Take all medicine as directed by your caregiver. Follow the directions carefully.  Ask your caregiver if there is anything you should not do while you recover (activities, lifting, work, sex).  If you had surgery to repair a communicating hydrocele, recovery time may vary. Ask you caregiver about your recovery time.  Avoid heavy lifting for 4 to 6 weeks.  If you had an incision on the scrotum or groin, wash it for 2 to 3 days after surgery. Do this as long as the skin is closed and there are no gaps in the wound. Wash gently, and avoid rubbing the incision.  Keep all follow-up appointments. SEEK MEDICAL CARE IF:   Your scrotum seems to be getting larger.  The area becomes more and more uncomfortable. SEEK IMMEDIATE MEDICAL CARE IF:  You have a fever. Document Released: 07/25/2009 Document Revised: 11/25/2012 Document Reviewed: 07/25/2009 [facility] Patient Information 2015 [place], [place]. This information is not intended to replace advice given to you by your health care provider. Make sure you discuss any questions you have with your health care provider. Epididymitis/CYST Epididymitis is a swelling (inflammation) of the epididymis. The epididymis is a cord-like structure along the back part of the testicle. Epididymitis MAY BE caused  by infection OR by activity OR no known cause . This is usually a sudden problem beginning with chills, fever (if due to infection) and pain behind the scrotum and in the testicle. There may be swelling and redness of the testicle. DIAGNOSIS  Physical examination will reveal a tender, swollen epididymis. Sometimes, cultures are obtained  from the urine or from prostate secretions to help find out if there is an infection or if the cause is a different problem. Sometimes, blood work is performed to see if your Phillippi blood cell count is elevated and if a germ (bacterial) or viral infection is present. Using this knowledge, an appropriate medicine which kills germs (antibiotic) can be chosen by your caregiver. A viral infection causing epididymitis will most often go away (resolve) without treatment. HOME CARE INSTRUCTIONS   Hot sitz baths for 20 minutes, 4 times per day, may help relieve pain.  Only take over-the-counter or prescription medicines for pain, discomfort or fever as directed by your caregiver.  Take all medicines, including antibiotics, as directed. Take the antibiotics for the full prescribed length of time even if you are feeling better.  It is very important to keep all follow-up appointments. SEEK IMMEDIATE MEDICAL CARE IF:   You have a fever.  You have pain not relieved with medicines.  You have any worsening of your problems.  Your pain seems to come and go.  You develop pain, redness, and swelling in the scrotum and surrounding areas. MAKE SURE YOU:   Understand these instructions.  Will watch your condition.  Will get help right away if you are not doing well or get worse. Document Released: 02/02/2000 Document Revised: 04/29/2011 Document Reviewed: 12/22/2008 [facility] Patient Information 2015 [place]. This information is not intended to replace advice given to you by your health care provider. Make sure you discuss any questions you have with your health care provider.

## 2013-11-23 NOTE — ED Notes (Signed)
Pt c/o bilateral testicle pain and swelling x 2 days

## 2013-11-23 NOTE — ED Provider Notes (Signed)
CSN: [id number]     Arrival date & time 11/23/13  1042 History   First MD Initiated Contact with Patient 11/23/13 1316     Chief Complaint  Patient presents with  . Testicle Pain     (Consider location/radiation/quality/duration/timing/severity/associated sxs/prior Treatment) HPI Patient reports he developed testicular pain and swelling that started Sunday. He reports is predominantly the right testicle which is swollen and quite painful. Any kind of movement or pressure makes it worse. Patient denies any activities or injuries he sustained that he thinks precipitated it. He reports that he is having no difficulty urinating no pain no burning. He reports he still the same partner for 16 years. He denies risk of sexually transmitted disease. Patient reports last sexual activity was approximately 5 months ago. He reports significant problems with impotence due to his diabetes. Patient had a left inguinal hernia repair previously. He denies that he's had any problems with pain or swelling in that area  Past Medical History  Diagnosis Date  . Diabetes mellitus   . Genital herpes   . Arthritis   . Hyperlipidemia    History reviewed. No pertinent past surgical history. History reviewed. No pertinent family history. History  Substance Use Topics  . Smoking status: Current Every Day Smoker -- 30 years    Types: Cigarettes  . Smokeless tobacco: Not on file     Comment: 7 cigs a day  . Alcohol Use: 8.4 oz/week    14 Glasses of wine per week    Review of Systems 10 Systems reviewed and are negative for acute change except as noted in the HPI.    Allergies  Lipitor and Tramadol  Home Medications   Prior to Admission medications   Medication Sig Start Date End Date Taking? Authorizing Provider  aspirin 81 MG tablet Take 81 mg by mouth daily.   Yes Historical Provider, MD  glimepiride (AMARYL) 4 MG tablet Take 1 tablet (4 mg total) by mouth daily before breakfast. 05/04/13  Yes Bryan R  Hess, DO  glipiZIDE (GLUCOTROL) 5 MG tablet Take 5 mg by mouth daily before breakfast.  05/24/13  Yes Historical Provider, MD  metFORMIN (GLUCOPHAGE) 1000 MG tablet Take 1,000 mg by mouth daily with breakfast.   Yes Historical Provider, MD  tamsulosin (FLOMAX) 0.4 MG CAPS capsule Take 1 capsule (0.4 mg total) by mouth daily. 07/13/13  Yes Bryan R Hess, DO  ciprofloxacin (CIPRO) 500 MG tablet Take 1 tablet (500 mg total) by mouth 2 (two) times daily. One po bid x 7 days 11/23/13   Arby BarretteMarcy Rishaan Gunner, MD  HYDROcodone-acetaminophen (NORCO/VICODIN) 5-325 MG per tablet Take 1-2 tablets by mouth every 6 (six) hours as needed for moderate pain or severe pain. 11/23/13   Arby BarretteMarcy Ivania Teagarden, MD   BP 125/81  Pulse 78  Temp(Src) 98.6 F (37 C) (Oral)  Resp 16  SpO2 100% Physical Exam Constitutional: Patient is alert and nontoxic, well in appearance. He is expressing moderate pain. Head: Normocephalic atraumatic Eyes: Extraocular motions are intact Respiratory: Respirations are calm and nonlabored. Abdomen: Abdomen is soft and nontender without mass. Genital: Normal visual inspection of the penis. Patient does have foreskin there is no erythema no swelling. The right testicle has approximately one and half centimeter palpable tender mass medially and inferiorly. Slight enlargement of the testicle and generally. The right testicle is tender. There is no mass within the inguinal canal bilaterally. The left testicle has normal contours patient endorses some mild tenderness to palpation. No general scrotal edema  or skin abnormalities. Neurologic: Patient is alert and oriented x3 he is coordinated all his movements speech is normal. Skin: Warm and dry ED Course  Procedures (including critical care time) Labs Review Labs Reviewed  CBC - Abnormal; Notable for the following:    WBC 17.1 (*)    All other components within normal limits  URINALYSIS, ROUTINE W REFLEX MICROSCOPIC - Abnormal; Notable for the following:     APPearance HAZY (*)    Glucose, UA 250 (*)    Hgb urine dipstick TRACE (*)    Leukocytes, UA MODERATE (*)    All other components within normal limits  URINE MICROSCOPIC-ADD ON - Abnormal; Notable for the following:    Squamous Epithelial / LPF FEW (*)    Bacteria, UA MANY (*)    All other components within normal limits  URINE CULTURE  BASIC METABOLIC PANEL    Imaging Review US Scrotum  11/23/2013   CLINICAL DATA:  Right worse than left testicular pain for 2 days. No known injury.  EXAM: SCROTAL ULTRASOUND  DOPPLER ULTRASOUND OF THE TESTICLES  TECHNIQUE: Complete ultrasound examination of the testicles, epididymis, and other scrotal structures was performed. Color and spectral Doppler ultrasound were also utilized to evaluate blood flow to the testicles.  COMPARISON:  None.  FINDINGS: Right testicle  Measurements: 3.4 x 2.8 x 3.0 cm. No mass or microlithiasis visualized.  Left testicle  Measurements: 3.6 x 3.1 x 2.2 cm. No mass or microlithiasis visualized.  Right epididymis: A large cyst in the right epididymis measures 1.9 x 1.3 x 2.7 cm  Left epididymis:  Normal in size and appearance.  Hydrocele:  Large bilateral hydroceles are identified  Varicocele:  None visualized.  Pulsed Doppler interrogation of both testes demonstrates low resistance arterial and venous waveforms bilaterally.  IMPRESSION: No acute abnormality.  Large bilateral hydroceles.  Large cyst in the right epididymis.   Electronically Signed   By: Drusilla Kanner M.D.   On: 11/23/2013 12:13   [place] Art/ven Flow Abd Pelv Doppler  11/23/2013   CLINICAL DATA:  Right worse than left testicular pain for 2 days. No known injury.  EXAM: SCROTAL ULTRASOUND  DOPPLER ULTRASOUND OF THE TESTICLES  TECHNIQUE: Complete ultrasound examination of the testicles, epididymis, and other scrotal structures was performed. Color and spectral Doppler ultrasound were also utilized to evaluate blood flow to the testicles.  COMPARISON:  None.  FINDINGS:  Right testicle  Measurements: 3.4 x 2.8 x 3.0 cm. No mass or microlithiasis visualized.  Left testicle  Measurements: 3.6 x 3.1 x 2.2 cm. No mass or microlithiasis visualized.  Right epididymis: A large cyst in the right epididymis measures 1.9 x 1.3 x 2.7 cm  Left epididymis:  Normal in size and appearance.  Hydrocele:  Large bilateral hydroceles are identified  Varicocele:  None visualized.  Pulsed Doppler interrogation of both testes demonstrates low resistance arterial and venous waveforms bilaterally.  IMPRESSION: No acute abnormality.  Large bilateral hydroceles.  Large cyst in the right epididymis.   Electronically Signed   By: Drusilla Kanner M.D.   On: 11/23/2013 12:13     EKG Interpretation None      MDM   Final diagnoses:  Epididymal cyst  Testicular pain  Hydrocele in adult   Ultrasound shows a epididymal cyst and hydrocele. At this point patient does not appear to have risk for sexually transmitted disease. Patient will be treated for pain and referred to urology for outpatient followup. He will empirically be started on Cipro  for epididymal enlargement/cyst that is very tender to palpation. Although ultrasound does not acutely show a general epididymitis, patient does have bilateral testicle tenderness and maybe developing epididymitis.    Arby Barrette, MD 11/23/13 1352

## 2013-11-26 LAB — URINE CULTURE: Colony Count: >=100000

## 2013-11-29 ENCOUNTER — Telehealth (HOSPITAL_BASED_OUTPATIENT_CLINIC_OR_DEPARTMENT_OTHER): Payer: Self-pay

## 2013-11-29 NOTE — Telephone Encounter (Signed)
Post ED Visit - Positive Culture Follow-up  Culture report reviewed by antimicrobial stewardship pharmacist: []  Wes Dulaney, Pharm.D., BCPS []  Celedonio MiyamotoJeremy Frens, Pharm.D., BCPS []  Georgina PillionElizabeth Martin, Pharm.D., BCPS []  TolletteMinh Pham, [address]Ph[address]D., BCPS, AAHIVP [x]  Estella HuskMichelle Turner, Pharm.D., BCPS, AAHIVP []  Carly Sabat, Pharm.D. []  Enzo BiNathan Batchelder, [address]Pharm[address]D.  Positive Urine culture Treated with Ciprofloxacin, organism sensitive to the same and no further patient follow-up is required at this time.  Arvid RightClark, Nhyla Nappi Dorn 11/29/2013, 5:13 AM

## 2013-12-09 ENCOUNTER — Other Ambulatory Visit: Payer: Self-pay | Admitting: Family Medicine

## 2013-12-10 ENCOUNTER — Telehealth: Payer: Self-pay | Admitting: *Deleted

## 2013-12-10 NOTE — Telephone Encounter (Signed)
Received a refill request from [facility] for atorvastatin 40 mg tab. Medication is not listed on current med list.  Clovis PuMartin, Keajah Killough L, RN

## 2013-12-10 NOTE — Telephone Encounter (Signed)
Needs appointment to discuss if he needs to be on one.  Thanks Tesoro CorporationBryan R. Paulina FusiHess, DO of [facility] 12/10/2013, 1:26 PM

## 2013-12-13 NOTE — Telephone Encounter (Signed)
Left voice message for pt to call and schedule an appt with PCP to discuss medication.  Clovis PuMartin, Tamika L, RN

## 2013-12-16 ENCOUNTER — Encounter: Payer: Self-pay | Admitting: *Deleted

## 2013-12-16 NOTE — Telephone Encounter (Signed)
This encounter was created in error - please disregard.

## 2014-01-19 ENCOUNTER — Ambulatory Visit (INDEPENDENT_AMBULATORY_CARE_PROVIDER_SITE_OTHER): Payer: Medicare Other | Admitting: Family Medicine

## 2014-01-19 VITALS — BP 148/95 | HR 80 | Temp 98.0°F | Wt 177.0 lb

## 2014-01-19 DIAGNOSIS — M799 Soft tissue disorder, unspecified: Secondary | ICD-10-CM

## 2014-01-19 DIAGNOSIS — M1990 Unspecified osteoarthritis, unspecified site: Secondary | ICD-10-CM

## 2014-01-19 DIAGNOSIS — M7989 Other specified soft tissue disorders: Secondary | ICD-10-CM

## 2014-01-19 MED ORDER — CYCLOBENZAPRINE HCL 5 MG PO TABS: 5.0000 mg | ORAL_TABLET | Freq: Three times a day (TID) | ORAL | Status: DC | PRN

## 2014-01-19 NOTE — Progress Notes (Signed)
Patient ID: [name] [name], male   DOB: 09/30/1955, 58 y.o.   MRN: [id number]  HPI:  Pt presents for a same day appointment to discuss arthritis.  Reports has arthritis all over his body. Specifically in shoulders, neck, and back. Has tried ibuprofen without any relief. This is an ongoing chronic issue for him. He took a couple of his wife's vicodin with improvement, was finally able to sleep. Also endorses buying some vicodin off the street for pain relief.  Also notes having a lump on his R posterior shoulder for at least a month. Having R shoulder and neck pain, causing headache.   ROS: See HPI  PMFSH: hx T2DM, tobacco abuse, allergic rhinitis, genital herpes, HLD, BPH  PHYSICAL EXAM: BP 148/95 mmHg  Pulse 80  Temp(Src) 98 F (36.7 C) (Oral)  Wt 177 lb (80.287 kg) Gen: NAD HEENT: NCAT. No meningeal signs MSK: R trapezius muscle TTP along shoulder and neck. Full ROM of neck except for lateral flexion to R (limited by muscular pain) Skin: 2cm mobile soft tissue subcutaneous spherical mass on posterior shoulder. nontender to palpation. slight fluctuance, possibly fluid filled. No warmth or erythema.  ASSESSMENT/PLAN:  1. Generalized arthritis - patient here today requesting pain relief. Recommend OTC topical capsaicin roll-on cream. Also rx flexeril given component of muscle spasm on exam. F/u with PCP in a few weeks for additional discussion.  2. Soft tissue mass - suspect lipoma vs. Benign fluid filled cyst. Doubt this is contributing to his R neck and shoulder pain, this seems more related to muscle spasm.  FOLLOW UP: F/u in a few weeks with PCP for chronic pain  GrenadaBrittany J. Pollie MeyerMcIntyre, MD [facility]

## 2014-01-19 NOTE — Patient Instructions (Signed)
Try capsaicin roll on cream. You can buy this over the counter. I sent in flexeril, a muscle spasm medicine. Use caution because this may make you sleepy.  Follow up with Dr. Paulina FusiHess in 2-3 weeks for your pain.  Happy Holidays!  Dr. Pollie MeyerMcIntyre

## 2014-05-23 ENCOUNTER — Ambulatory Visit: Payer: Medicare Other | Admitting: Family Medicine

## 2014-06-30 ENCOUNTER — Encounter: Payer: Medicare Other | Admitting: Family Medicine

## 2014-07-21 ENCOUNTER — Emergency Department (HOSPITAL_COMMUNITY): Payer: Medicare Other

## 2014-07-21 ENCOUNTER — Emergency Department (HOSPITAL_COMMUNITY)
Admission: EM | Admit: 2014-07-21 | Discharge: 2014-07-21 | Disposition: A | Discharge status: Home / Self Care | Payer: Medicare Other | Attending: Emergency Medicine | Admitting: Emergency Medicine

## 2014-07-21 ENCOUNTER — Encounter (HOSPITAL_COMMUNITY): Payer: Self-pay | Admitting: Emergency Medicine

## 2014-07-21 DIAGNOSIS — Z720 Tobacco use: Secondary | ICD-10-CM | POA: Diagnosis not present

## 2014-07-21 DIAGNOSIS — Z8619 Personal history of other infectious and parasitic diseases: Secondary | ICD-10-CM | POA: Insufficient documentation

## 2014-07-21 DIAGNOSIS — R1032 Left lower quadrant pain: Secondary | ICD-10-CM | POA: Diagnosis not present

## 2014-07-21 DIAGNOSIS — Z7982 Long term (current) use of aspirin: Secondary | ICD-10-CM | POA: Diagnosis not present

## 2014-07-21 DIAGNOSIS — M1990 Unspecified osteoarthritis, unspecified site: Secondary | ICD-10-CM | POA: Insufficient documentation

## 2014-07-21 DIAGNOSIS — Z79899 Other long term (current) drug therapy: Secondary | ICD-10-CM | POA: Insufficient documentation

## 2014-07-21 DIAGNOSIS — R109 Unspecified abdominal pain: Secondary | ICD-10-CM | POA: Diagnosis present

## 2014-07-21 DIAGNOSIS — E119 Type 2 diabetes mellitus without complications: Secondary | ICD-10-CM | POA: Insufficient documentation

## 2014-07-21 LAB — COMPREHENSIVE METABOLIC PANEL
ALBUMIN: 4 g/dL (ref 3.5–5.0)
ALK PHOS: 86 U/L (ref 38–126)
ALT: 16 U/L — ABNORMAL LOW (ref 17–63)
ANION GAP: 10 (ref 5–15)
AST: 16 U/L (ref 15–41)
BUN: 18 mg/dL (ref 6–20)
CALCIUM: 9.5 mg/dL (ref 8.9–10.3)
CO2: 20 mmol/L — ABNORMAL LOW (ref 22–32)
Chloride: 108 mmol/L (ref 101–111)
Creatinine, Ser: 1.16 mg/dL (ref 0.61–1.24)
GFR calc Af Amer: >60 mL/min (ref >60)
Glucose, Bld: 163 mg/dL — ABNORMAL HIGH (ref 65–99)
POTASSIUM: 4.2 mmol/L (ref 3.5–5.1)
Sodium: 138 mmol/L (ref 135–145)
TOTAL PROTEIN: 7.3 g/dL (ref 6.5–8.1)
Total Bilirubin: 0.3 mg/dL (ref 0.3–1.2)

## 2014-07-21 LAB — CBC WITH DIFFERENTIAL/PLATELET
Basophils Absolute: 0.1 10*3/uL (ref 0.0–0.1)
Basophils Relative: 1 % (ref 0–1)
EOS ABS: 0.4 10*3/uL (ref 0.0–0.7)
Eosinophils Relative: 3 % (ref 0–5)
HCT: 44.1 % (ref 39.0–52.0)
Hemoglobin: 14.5 g/dL (ref 13.0–17.0)
Lymphocytes Relative: 35 % (ref 12–46)
Lymphs Abs: 3.8 10*3/uL (ref 0.7–4.0)
MCH: 28.8 pg (ref 26.0–34.0)
MCHC: 32.9 g/dL (ref 30.0–36.0)
MCV: 87.5 fL (ref 78.0–100.0)
MONO ABS: 1 10*3/uL (ref 0.1–1.0)
Monocytes Relative: 9 % (ref 3–12)
NEUTROS PCT: 52 % (ref 43–77)
Neutro Abs: 5.7 10*3/uL (ref 1.7–7.7)
Platelets: 314 10*3/uL (ref 150–400)
RBC: 5.04 MIL/uL (ref 4.22–5.81)
RDW: 13.9 % (ref 11.5–15.5)
WBC: 11 10*3/uL — AB (ref 4.0–10.5)

## 2014-07-21 LAB — URINALYSIS, ROUTINE W REFLEX MICROSCOPIC
Bilirubin Urine: NEGATIVE
Glucose, UA: NEGATIVE mg/dL
Hgb urine dipstick: NEGATIVE
KETONES UR: NEGATIVE mg/dL
Leukocytes, UA: NEGATIVE
Nitrite: NEGATIVE
PROTEIN: NEGATIVE mg/dL
SPECIFIC GRAVITY, URINE: 1.008 (ref 1.005–1.030)
Urobilinogen, UA: 0.2 mg/dL (ref 0.0–1.0)
pH: 6 (ref 5.0–8.0)

## 2014-07-21 LAB — LIPASE, BLOOD: Lipase: 30 U/L (ref 22–51)

## 2014-07-21 MED ORDER — IOHEXOL 300 MG/ML  SOLN
50.0000 mL | Freq: Once | INTRAMUSCULAR | Status: AC | PRN
  Administered 2014-07-21: 50 mL via ORAL

## 2014-07-21 MED ORDER — MORPHINE SULFATE 4 MG/ML IJ SOLN
4.0000 mg | Freq: Once | INTRAMUSCULAR | Status: AC
  Administered 2014-07-21: 4 mg via INTRAVENOUS
  Filled 2014-07-21: qty 1

## 2014-07-21 MED ORDER — IOHEXOL 300 MG/ML  SOLN
100.0000 mL | Freq: Once | INTRAMUSCULAR | Status: AC | PRN
  Administered 2014-07-21: 100 mL via INTRAVENOUS

## 2014-07-21 MED ORDER — ONDANSETRON HCL 4 MG/2ML IJ SOLN
4.0000 mg | Freq: Once | INTRAMUSCULAR | Status: AC
  Administered 2014-07-21: 4 mg via INTRAVENOUS
  Filled 2014-07-21: qty 2

## 2014-07-21 MED ORDER — HYDROCODONE-ACETAMINOPHEN 5-325 MG PO TABS: 2.0000 | ORAL_TABLET | ORAL | Status: AC | PRN

## 2014-07-21 MED ORDER — CYCLOBENZAPRINE HCL 10 MG PO TABS
10.0000 mg | ORAL_TABLET | Freq: Two times a day (BID) | ORAL | Status: AC | PRN
Start: 2014-07-21 — End: ?

## 2014-07-21 NOTE — ED Notes (Signed)
Pt c/o left lower quadrant abdominal pain x 1 month, denies n/v/diarrhea. States that he has not been seen for this issue.

## 2014-07-21 NOTE — ED Provider Notes (Signed)
CSN: [id number]     Arrival date & time 07/21/14  1357 History   First MD Initiated Contact with Patient 07/21/14 1511     Chief Complaint  Patient presents with  . Abdominal Pain     (Consider location/radiation/quality/duration/timing/severity/associated sxs/prior Treatment) HPI Comments: Patient is a 59 year old male with a past medical history of diabetes, arthritis, and hyperlipidemia who presents with abdominal pain for 1 month that acutely worsened today. The pain is located in the left lower quadrant and does not radiate. The pain is described as aching and severe. He reports moving a heavy flower pot which he thinks exacerbated the pain. The pain started gradually and progressively worsened since the onset. No alleviating/aggravating factors. The patient has tried nothing for symptoms without relief. Associated symptoms include nothing. Patient denies fever, headache, NVD, chest pain, SOB, dysuria, constipation. Patient had a left inguinal hernia repair 1 year and 3 months ago but he states this does not feel the same.    Patient is a 59 y.o. male presenting with abdominal pain.  Abdominal Pain Associated symptoms: no chest pain, no chills, no diarrhea, no dysuria, no fatigue, no fever, no nausea, no shortness of breath and no vomiting     Past Medical History  Diagnosis Date  . Diabetes mellitus   . Genital herpes   . Arthritis   . Hyperlipidemia    History reviewed. No pertinent past surgical history. History reviewed. No pertinent family history. History  Substance Use Topics  . Smoking status: Current Every Day Smoker -- 30 years    Types: Cigarettes  . Smokeless tobacco: Not on file     Comment: 7 cigs a day  . Alcohol Use: 8.4 oz/week    14 Glasses of wine per week    Review of Systems  Constitutional: Negative for fever, chills and fatigue.  HENT: Negative for trouble swallowing.   Eyes: Negative for visual disturbance.  Respiratory: Negative for shortness of  breath.   Cardiovascular: Negative for chest pain and palpitations.  Gastrointestinal: Positive for abdominal pain. Negative for nausea, vomiting and diarrhea.  Genitourinary: Negative for dysuria and difficulty urinating.  Musculoskeletal: Negative for arthralgias and neck pain.  Skin: Negative for color change.  Neurological: Negative for dizziness and weakness.  Psychiatric/Behavioral: Negative for dysphoric mood.      Allergies  Lipitor and Tramadol  Home Medications   Prior to Admission medications   Medication Sig Start Date End Date Taking? Authorizing Provider  aspirin 81 MG tablet Take 81 mg by mouth daily.   Yes Historical Provider, MD  glipiZIDE (GLUCOTROL) 5 MG tablet Take 5 mg by mouth daily before breakfast.  05/24/13  Yes Historical Provider, MD  metFORMIN (GLUCOPHAGE) 1000 MG tablet Take 1,000 mg by mouth daily with breakfast.   Yes Historical Provider, MD  tamsulosin (FLOMAX) 0.4 MG CAPS capsule Take 1 capsule (0.4 mg total) by mouth daily. 07/13/13  Yes Bryan R Hess, DO  cyclobenzaprine (FLEXERIL) 5 MG tablet Take 1 tablet (5 mg total) by mouth 3 (three) times daily as needed for muscle spasms. Patient not taking: Reported on 07/21/2014 01/19/14   Latrelle DodrillBrittany J McIntyre, MD  glimepiride (AMARYL) 4 MG tablet TAKE ONE TABLET BY MOUTH ONCE DAILY BEFORE BREAKFAST Patient not taking: Reported on 07/21/2014 12/09/13   Briscoe DeutscherBryan R Hess, DO  HYDROcodone-acetaminophen (NORCO/VICODIN) 5-325 MG per tablet Take 1-2 tablets by mouth every 6 (six) hours as needed for moderate pain or severe pain. Patient not taking: Reported on 07/21/2014  11/23/13   Arby Barrette, MD   BP 128/76 mmHg  Pulse 77  Temp(Src) 97.8 F (36.6 C) (Oral)  Resp 18  SpO2 98% Physical Exam  Constitutional: He is oriented to person, place, and time. He appears well-developed and well-nourished. No distress.  HENT:  Head: Normocephalic and atraumatic.  Eyes: Conjunctivae and EOM are normal.  Neck: Normal range of  motion.  Cardiovascular: Normal rate and regular rhythm.  Exam reveals no gallop and no friction rub.   No murmur heard. Pulmonary/Chest: Effort normal and breath sounds normal. He has no wheezes. He has no rales. He exhibits no tenderness.  Abdominal: Soft. He exhibits no distension. There is tenderness. There is no rebound.  LLQ tenderness to palpation that extends to the left inguinal area. No other focal tenderness to palpation.   Musculoskeletal: Normal range of motion.  Neurological: He is alert and oriented to person, place, and time. Coordination normal.  Speech is goal-oriented. Moves limbs without ataxia.   Skin: Skin is warm and dry.  Psychiatric: He has a normal mood and affect. His behavior is normal.  Nursing note and vitals reviewed.   ED Course  Procedures (including critical care time) Labs Review Labs Reviewed  CBC WITH DIFFERENTIAL/PLATELET - Abnormal; Notable for the following:    WBC 11.0 (*)    All other components within normal limits  COMPREHENSIVE METABOLIC PANEL - Abnormal; Notable for the following:    CO2 20 (*)    Glucose, Bld 163 (*)    ALT 16 (*)    All other components within normal limits  LIPASE, BLOOD  URINALYSIS, ROUTINE W REFLEX MICROSCOPIC (NOT AT [facility])    Imaging Review Ct Abdomen Pelvis W Contrast  07/21/2014   CLINICAL DATA:  Abdominal pain, left lower quadrant pain.  EXAM: CT ABDOMEN AND PELVIS WITH CONTRAST  TECHNIQUE: Multidetector CT imaging of the abdomen and pelvis was performed using the standard protocol following bolus administration of intravenous contrast.  CONTRAST:  50mL OMNIPAQUE IOHEXOL 300 MG/ML SOLN, OMNIPAQUE IOHEXOL 300 MG/ML SOLN  COMPARISON:  CT 07/11/2007  FINDINGS:  NO EXPLANATION FOR LEFT LOWER QUADRANT PAIN.: FINDINGS:  NO EXPLANATION FOR LEFT LOWER QUADRANT PAIN. 1. Lower chest: Lung bases are clear.  Hepatobiliary: No focal hepatic lesion. No biliary duct dilatation. Gallbladder is normal. Common bile duct is  normal.  Pancreas: There is a coarse calcification in the pancreatic head/uncinate measuring 7 mm which is increased from approximately 3 mm on comparison. There is mild pancreatic duct dilatation with the duct measuring up to 3 mm unchanged from 2009. No pancreatic atrophy or inflammation. The common bile duct is normal caliber.  Spleen: Adrenal glands are normal. 4 mm calculus in the mid right kidney. No ureterolithiasis or bladder calculi.  Adrenals/urinary tract: Stomach, small bowel, appendix, cecum are normal. The colon and rectosigmoid colon are normal.  Stomach/Bowel: Stomach, small bowel, appendix, and cecum are normal. The colon and rectosigmoid colon are normal.  Vascular/Lymphatic: Abdominal aorta is normal caliber. There is no retroperitoneal or periportal lymphadenopathy. No pelvic lymphadenopathy.  Reproductive: Normal prostate gland.  Musculoskeletal: No aggressive osseous lesion. Degenerative endplate change at L5-S1  Other: The surgical clips in the left groin region suggest hernia repair. No evidence of abdominal hernia.  IMPRESSION: 1. Normal appendix. 2. Chronic dilatation of the pancreatic duct. No biliary duct dilatation. No mass lesion. 3. Calcification in the pancreatic head is likely related to chronic inflammation. 4. Left hernia repair without complication.   Electronically Signed  By: Genevive Bi M.D.   On: 07/21/2014 17:07     EKG Interpretation None      MDM   Final diagnoses:  Left lower quadrant pain    4:08 PM Labs pending. Vitals stable and patient afebrile. Patient will have morphine and zofran for symptoms. Patient will have CT abdomen pelvis.   5:32 PM Labs show mildly elevated WBC without other acute changes. Patient's CT shows no acute changes. Patient possible has a muscle strain. Patient will have vicodin and flexeril for symptoms and PCP follow up. Vitals stable and patient afebrile.    Emilia Beck, PA-C 07/21/14 1733  Eber Hong,  MD 07/22/14 [phone]

## 2014-07-21 NOTE — Discharge Instructions (Signed)
Take vicodin as needed for pain. Take flexeril as needed for muscle spasm. Refer to attached documents for more information.  °

## 2014-07-21 NOTE — ED Notes (Signed)
Pt called RN back in to discuss an issue with his L ring finger. Pt sts when he bends it he can't get it back up without the use of his other hand. Pt sts this has been going on for two months. Denies injury.

## 2014-07-21 NOTE — ED Notes (Signed)
Pt transported to CT ?

## 2014-09-19 ENCOUNTER — Ambulatory Visit: Payer: Medicare Other | Admitting: Family Medicine

## 2015-02-08 ENCOUNTER — Other Ambulatory Visit: Payer: Self-pay | Admitting: Family Medicine

## 2015-02-08 NOTE — Telephone Encounter (Signed)
This is your patient, see med refill 

## 2015-02-09 NOTE — Telephone Encounter (Signed)
Hello, No medications were attached to this refill request.   Anders Simmondshristina Aryan Bello, MD [facility], PGY-1

## 2016-06-04 ENCOUNTER — Telehealth: Payer: Self-pay | Admitting: Family Medicine

## 2016-06-27 NOTE — Telephone Encounter (Signed)
No answer, could not leave message. [facility], has never seen current PCP. Remains unavailable via telephone.

## 2017-01-18 IMAGING — CT CT ABD-PELV W/ CM
2 of 5 series · 16 of 46 positions shown, 18 images · IV contrast (OMNIPAQUE 300)
Comparison: CT 07/11/2007

CLINICAL DATA: Abdominal pain, left lower quadrant pain.

EXAM:
CT ABDOMEN AND PELVIS WITH CONTRAST
TECHNIQUE: Multidetector CT imaging of the abdomen and pelvis was performed
using the standard protocol following bolus administration of
intravenous contrast.
CONTRAST:  50mL OMNIPAQUE IOHEXOL 300 MG/ML SOLN, 100mL OMNIPAQUE
IOHEXOL 300 MG/ML SOLN

[Series 2: abd/pel with · axial · 0.70mm/px · z∈[-0,+414]mm · 13 of 93 slices shown, 15 images]
[im 5/93  soft-tissue]
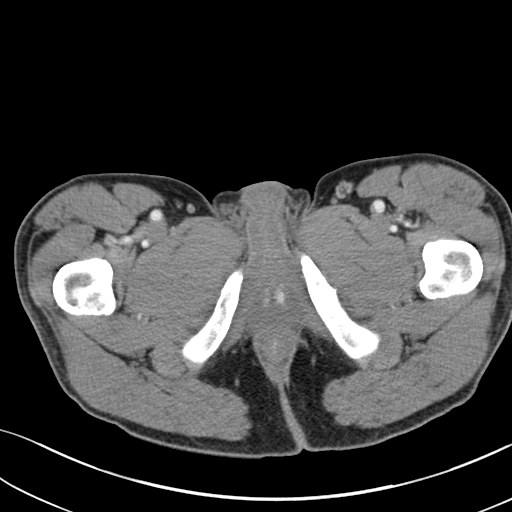
[im 5/93  bone]
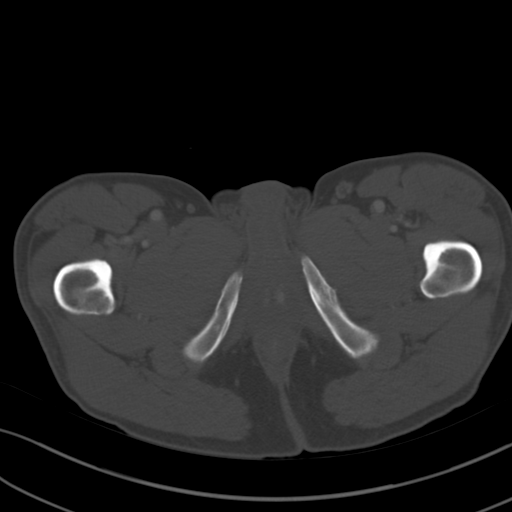
[im 15/93  soft-tissue]
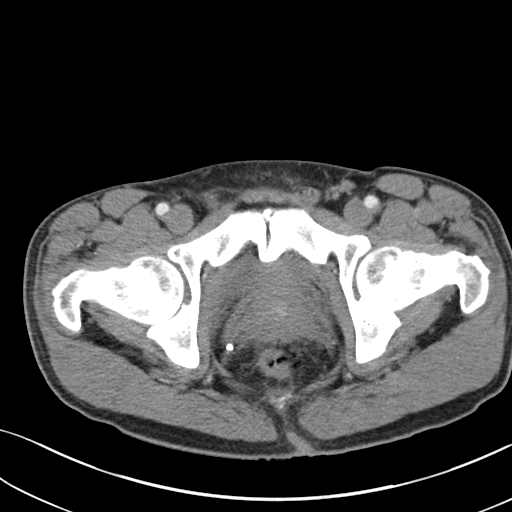
[im 20/93  soft-tissue]
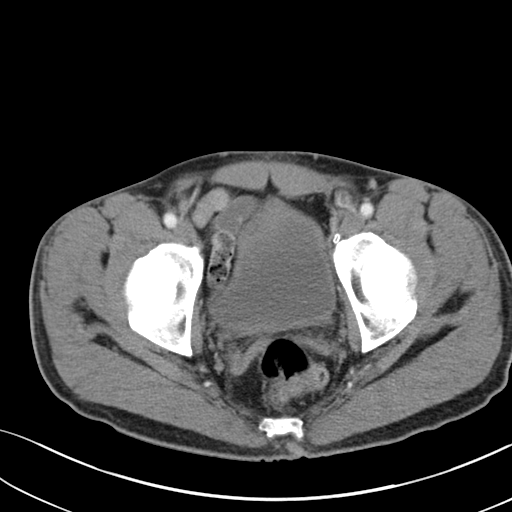
[im 25/93  soft-tissue]
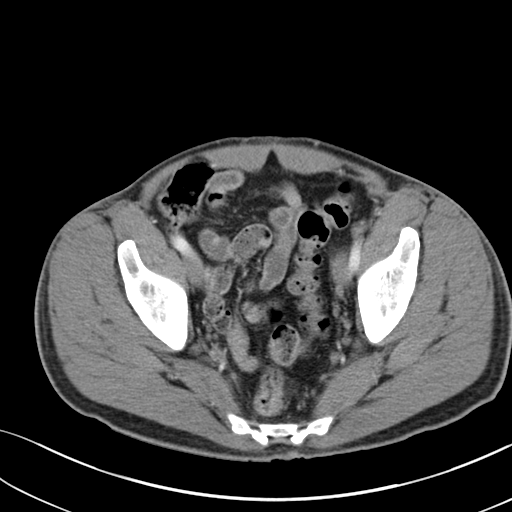
[im 34/93  soft-tissue]
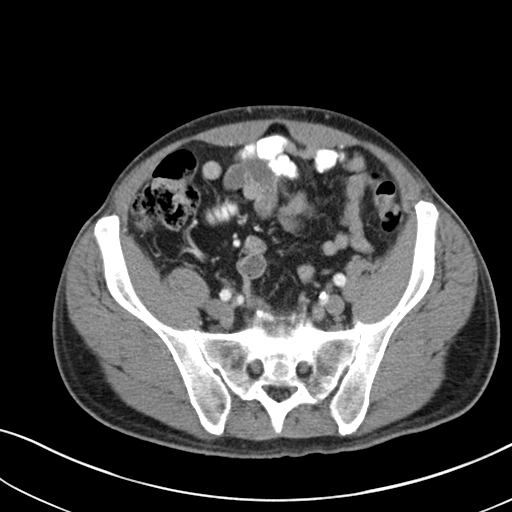
[im 39/93  soft-tissue]
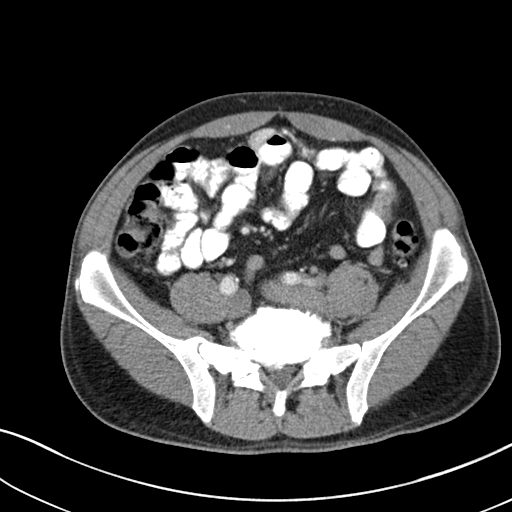
[im 49/93  soft-tissue]
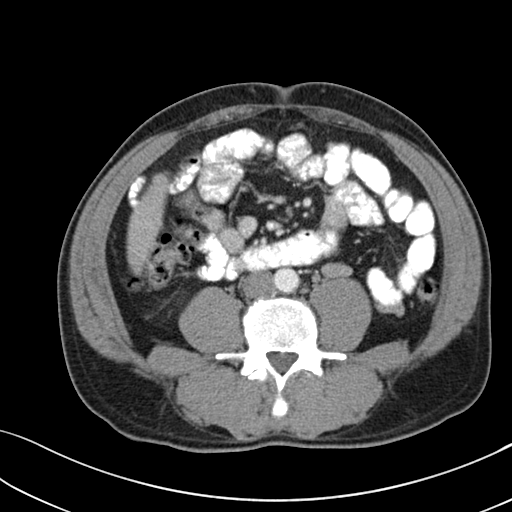
[im 54/93  soft-tissue]
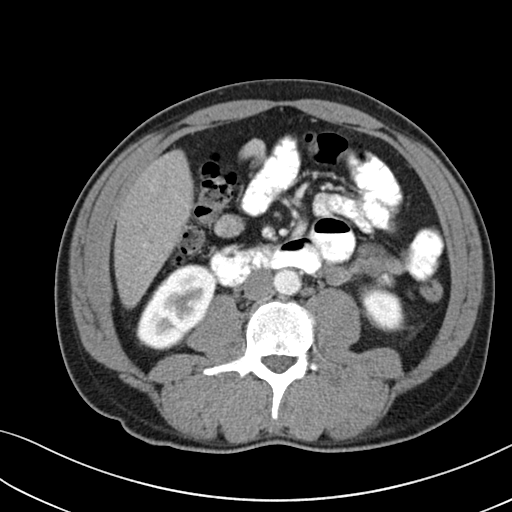
[im 59/93  soft-tissue]
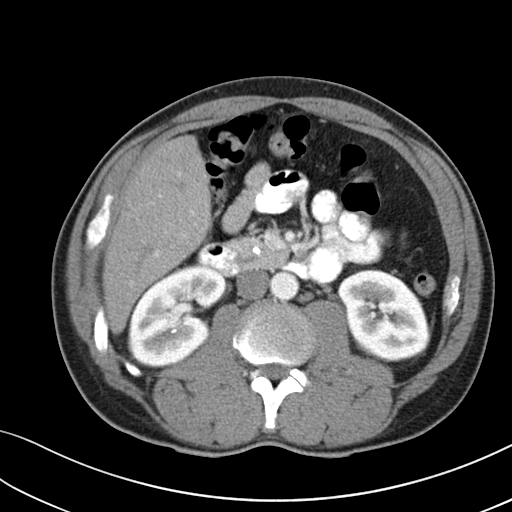
[im 59/93  bone]
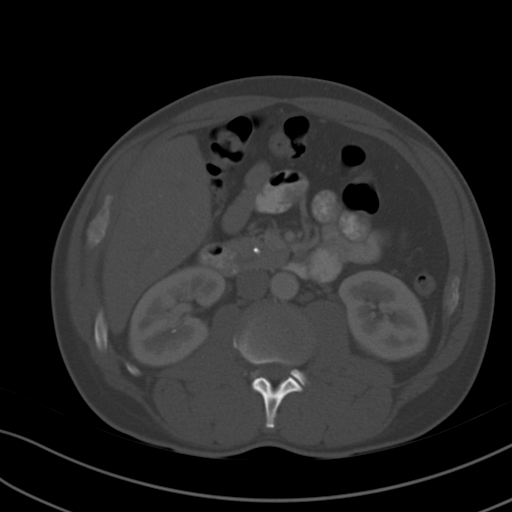
[im 68/93  soft-tissue]
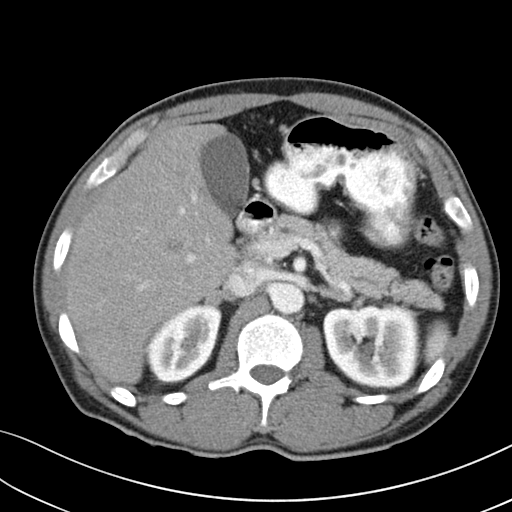
[im 73/93  soft-tissue]
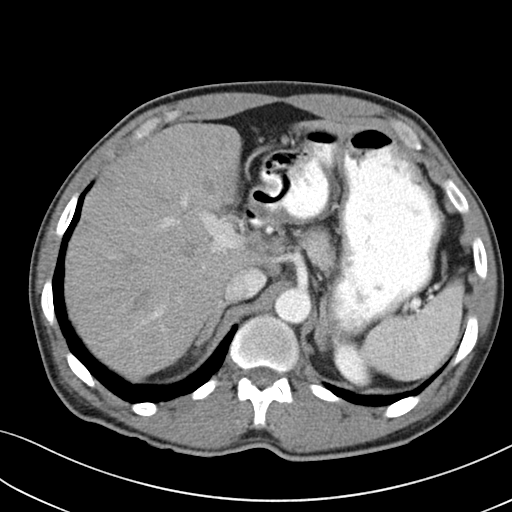
[im 78/93  soft-tissue]
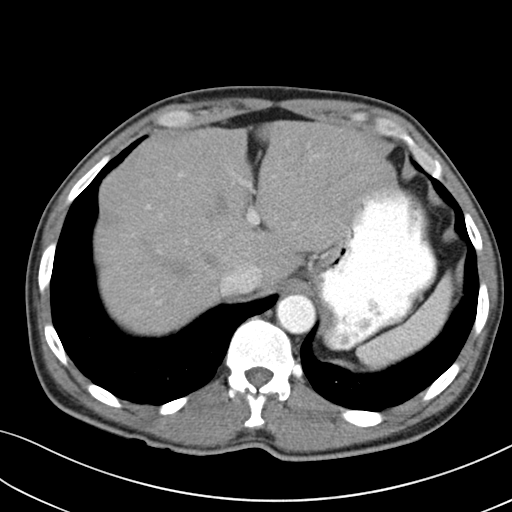
[im 88/93  soft-tissue]
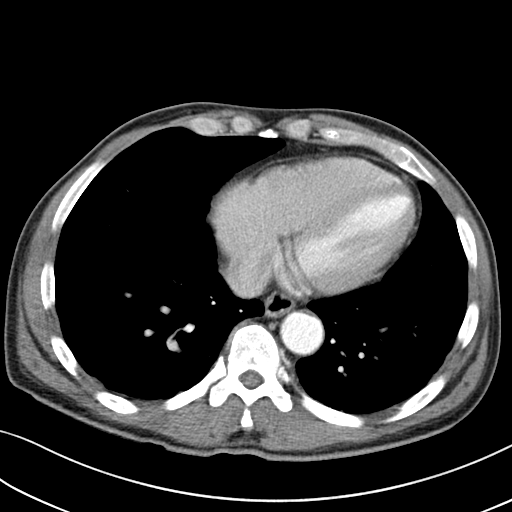

[Series 3: coronal a/|p · coronal · 0.71mm/px · 3 of 87 slices shown]
[im 29/87  soft-tissue]
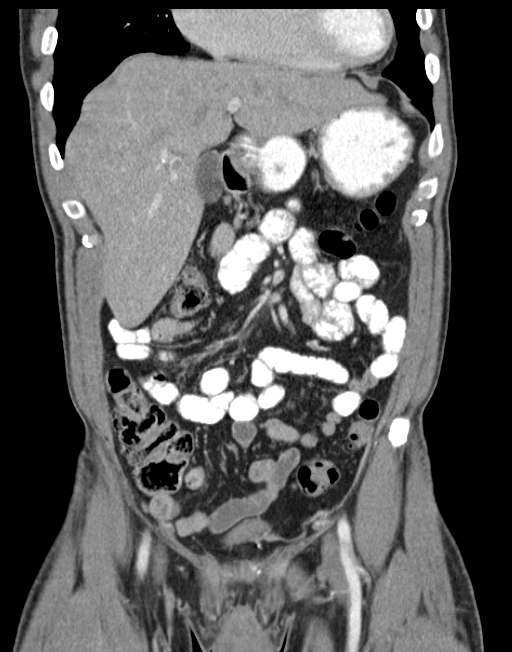
[im 39/87  soft-tissue]
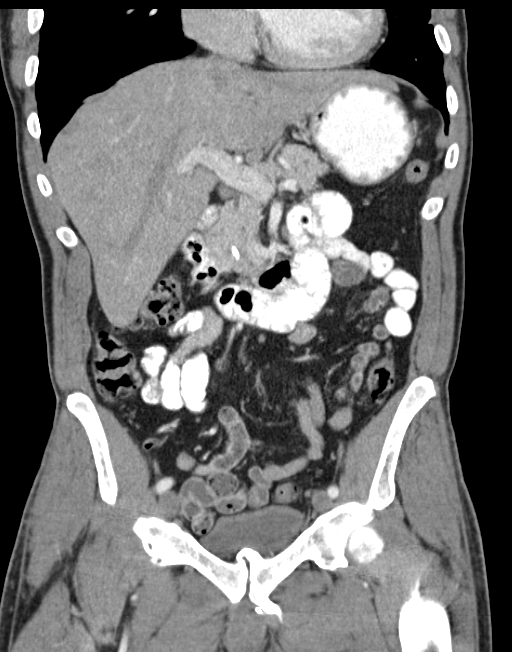
[im 48/87  soft-tissue]
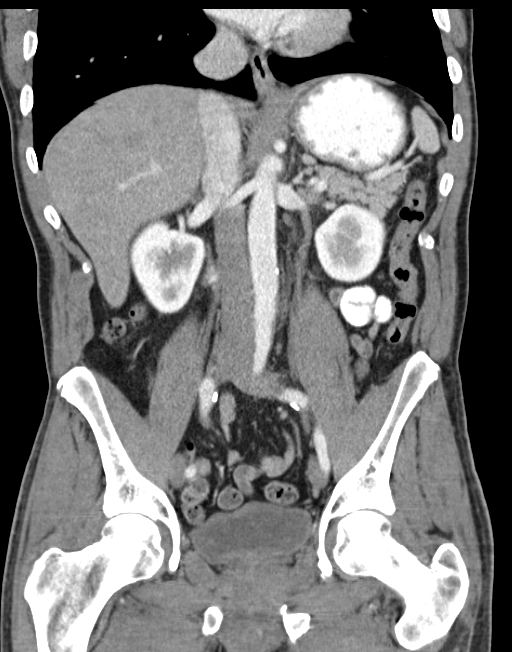

[16 of 46 positions shown; findings below may reference images not displayed]

FINDINGS: NO EXPLANATION FOR LEFT LOWER QUADRANT PAIN.:
FINDINGS: NO EXPLANATION FOR LEFT LOWER QUADRANT PAIN.
1.
Lower chest: Lung bases are clear.

Hepatobiliary: No focal hepatic lesion. No biliary duct dilatation.
Gallbladder is normal. Common bile duct is normal.

Pancreas: There is a coarse calcification in the pancreatic
head/uncinate measuring 7 mm which is increased from approximately 3
mm on comparison. There is mild pancreatic duct dilatation with the
duct measuring up to 3 mm unchanged from 3338. No pancreatic atrophy
or inflammation. The common bile duct is normal caliber.

Spleen: Adrenal glands are normal. 4 mm calculus in the mid right
kidney. No ureterolithiasis or bladder calculi.

Adrenals/urinary tract: Stomach, small bowel, appendix, cecum are
normal. The colon and rectosigmoid colon are normal.

Stomach/Bowel: Stomach, small bowel, appendix, and cecum are normal.
The colon and rectosigmoid colon are normal.

Vascular/Lymphatic: Abdominal aorta is normal caliber. There is no
retroperitoneal or periportal lymphadenopathy. No pelvic
lymphadenopathy.

Reproductive: Normal prostate gland.

Musculoskeletal: No aggressive osseous lesion. Degenerative endplate
change at L5-S1

Other: The surgical clips in the left groin region suggest hernia
repair. No evidence of abdominal hernia.
IMPRESSION: 1. Normal appendix.
2. Chronic dilatation of the pancreatic duct. No biliary duct
dilatation. No mass lesion.
3. Calcification in the pancreatic head is likely related to chronic
inflammation.
4. Left hernia repair without complication.
